# Patient Record
Sex: Female | Born: 1985 | Race: Black or African American | Hispanic: No | Marital: Single | Smoking: Never smoker
Health system: Southern US, Community
[De-identification: ages and names within clinical notes are randomized; demographics above are authoritative.]

## PROBLEM LIST (undated history)

## (undated) ENCOUNTER — Inpatient Hospital Stay (HOSPITAL_COMMUNITY): Payer: Self-pay

## (undated) DIAGNOSIS — E069 Thyroiditis, unspecified: Secondary | ICD-10-CM

## (undated) DIAGNOSIS — K219 Gastro-esophageal reflux disease without esophagitis: Secondary | ICD-10-CM

## (undated) DIAGNOSIS — F419 Anxiety disorder, unspecified: Secondary | ICD-10-CM

## (undated) DIAGNOSIS — I10 Essential (primary) hypertension: Secondary | ICD-10-CM

## (undated) DIAGNOSIS — D573 Sickle-cell trait: Secondary | ICD-10-CM

## (undated) DIAGNOSIS — D219 Benign neoplasm of connective and other soft tissue, unspecified: Secondary | ICD-10-CM

## (undated) DIAGNOSIS — D649 Anemia, unspecified: Secondary | ICD-10-CM

## (undated) HISTORY — DX: Essential (primary) hypertension: I10

## (undated) HISTORY — PX: WISDOM TOOTH EXTRACTION: SHX21

## (undated) HISTORY — DX: Anxiety disorder, unspecified: F41.9

---

## 2011-05-29 DIAGNOSIS — E010 Iodine-deficiency related diffuse (endemic) goiter: Secondary | ICD-10-CM | POA: Insufficient documentation

## 2014-01-14 ENCOUNTER — Emergency Department (HOSPITAL_COMMUNITY)
Admission: EM | Admit: 2014-01-14 | Discharge: 2014-01-14 | Disposition: A | Discharge status: Home / Self Care | Payer: 59 | Source: Home / Self Care

## 2014-01-14 DIAGNOSIS — R0789 Other chest pain: Secondary | ICD-10-CM

## 2014-01-14 DIAGNOSIS — R030 Elevated blood-pressure reading, without diagnosis of hypertension: Secondary | ICD-10-CM

## 2014-01-14 DIAGNOSIS — R071 Chest pain on breathing: Secondary | ICD-10-CM

## 2014-01-14 NOTE — ED Provider Notes (Signed)
CSN: [id number]     Arrival date & time 01/14/14  0945 History   First MD Initiated Contact with Patient 01/14/14 0955     No chief complaint on file.  (Consider location/radiation/quality/duration/timing/severity/associated sxs/prior Treatment) HPI Comments: 28 year old female presents to the urgent care for pain to the left lower anterolateral ribs. She was seen a week ago at [facility] for a lymph node evaluation and her blood pressure was 135/90. Subsequent blood pressures earlier this week was 146 over?, 130/70. Today 140/103. 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 She states she is under a lot of stress. This morning she noticed pain in the left anterolateral ribs at work. She was just coming on. It is described as a burning sensation. The burning comes and goes. It is of elicited with twisting the torso to the right. He may last up to 2 minutes at a time. No heaviness, tightness, pressure or fullness. Denies shortness of breath or GI symptoms. There is no retrosternal, precordial or anterior upper chest discomfort.   No past medical history on file. No past surgical history on file. No family history on file. History  Substance Use Topics  . Smoking status: Not on file  . Smokeless tobacco: Not on file  . Alcohol Use: Not on file   OB History   No data available     Review of Systems  Constitutional: Negative for fever, activity change and appetite change.  HENT: Negative.   Respiratory: Negative for cough, choking, chest tightness, shortness of breath, wheezing and stridor.   Cardiovascular: Negative for palpitations and leg swelling.  Gastrointestinal: Negative.   Genitourinary: Negative.   Musculoskeletal: Negative for arthralgias, back pain, gait problem, joint swelling, neck pain and neck stiffness.  Neurological: Negative.   Psychiatric/Behavioral: Negative.     Allergies  Review of patient's allergies  indicates not on file.  Home Medications   Prior to Admission medications   Not on File   BP 140/103  Pulse 103  Temp(Src) 97.5 F (36.4 C) (Oral)  Resp 16  SpO2 99% Physical Exam  Nursing note and vitals reviewed. Constitutional: She is oriented to person, place, and time. She appears well-developed and well-nourished. No distress.  Eyes: Conjunctivae and EOM are normal. Pupils are equal, round, and reactive to light.  Neck: Normal range of motion. Neck supple.  Cardiovascular: Normal rate, regular rhythm, normal heart sounds and intact distal pulses.   No murmur heard. Pulmonary/Chest: Effort normal. No respiratory distress. She has no wheezes. She has no rales. She exhibits no tenderness.  Abdominal: Soft. She exhibits no distension and no mass. There is no tenderness. There is no rebound and no guarding.  Musculoskeletal: She exhibits no edema and no tenderness.  Lymphadenopathy:    She has no cervical adenopathy.  Neurological: She is alert and oriented to person, place, and time. No cranial nerve deficit. She exhibits normal muscle tone. Coordination normal.  Skin: Skin is warm and dry. No rash noted. No erythema.  Psychiatric: She has a normal mood and affect. Her behavior is normal.    ED Course  Procedures (including critical care time) Labs Review Labs Reviewed - No data to display  Imaging Review No results found.  EKG: NSR, no ectopy. No ischemic changes or S-T abnormalities. MDM   1. Acute chest wall pain    Apply ice. NSAIDS prn F/U with a PCP for management of elevated BP Return for worsening or  new sx's or go to the ED as needed . Reassurance.    Janne Napoleon, NP 01/14/14 1051

## 2014-01-14 NOTE — ED Provider Notes (Signed)
Medical screening examination/treatment/procedure(s) were performed by resident physician or non-physician practitioner and as supervising physician I was immediately available for consultation/collaboration.   Pauline Good MD.   Billy Fischer, MD 01/14/14 [phone]

## 2014-01-14 NOTE — Discharge Instructions (Signed)
Chest Wall Pain Chest wall pain is pain felt in or around the chest bones and muscles. It may take up to 6 weeks to get better. It may take longer if you are active. Chest wall pain can happen on its own. Other times, things like germs, injury, coughing, or exercise can cause the pain. HOME CARE   Avoid activities that make you tired or cause pain. Try not to use your chest, belly (abdominal), or side muscles. Do not use heavy weights.  Put ice on the sore area.  Put ice in a plastic bag.  Place a towel between your skin and the bag.  Leave the ice on for 15-20 minutes for the first 2 days.  Only take medicine as told by your doctor. GET HELP RIGHT AWAY IF:   You have more pain or are very uncomfortable.  You have a fever.  Your chest pain gets worse.  You have new problems.  You feel sick to your stomach (nauseous) or throw up (vomit).  You start to sweat or feel lightheaded.  You have a cough with mucus (phlegm).  You cough up blood. MAKE SURE YOU:   Understand these instructions.  Will watch your condition.  Will get help right away if you are not doing well or get worse. Document Released: 10/09/2007 Document Revised: 07/15/2011 Document Reviewed: 12/17/2010 [facility] Patient Information 2015 [place], [place]. This information is not intended to replace advice given to you by your health care provider. Make sure you discuss any questions you have with your health care provider.  Chest Pain (Nonspecific) It is often hard to give a diagnosis for the cause of chest pain. There is always a chance that your pain could be related to something serious, such as a heart attack or a blood clot in the lungs. You need to follow up with your doctor. HOME CARE  If antibiotic medicine was given, take it as directed by your doctor. Finish the medicine even if you start to feel better.  For the next few days, avoid activities that bring on chest pain. Continue physical activities as  told by your doctor.  Do not use any tobacco products. This includes cigarettes, chewing tobacco, and e-cigarettes.  Avoid drinking alcohol.  Only take medicine as told by your doctor.  Follow your doctor's suggestions for more testing if your chest pain does not go away.  Keep all doctor visits you made. GET HELP IF:  Your chest pain does not go away, even after treatment.  You have a rash with blisters on your chest.  You have a fever. GET HELP RIGHT AWAY IF:   You have more pain or pain that spreads to your arm, neck, jaw, back, or belly (abdomen).  You have shortness of breath.  You cough more than usual or cough up blood.  You have very bad back or belly pain.  You feel sick to your stomach (nauseous) or throw up (vomit).  You have very bad weakness.  You pass out (faint).  You have chills. This is an emergency. Do not wait to see if the problems will go away. Call your local emergency services (911 in [place] Do not drive yourself to the hospital. MAKE SURE YOU:   Understand these instructions.  Will watch your condition.  Will get help right away if you are not doing well or get worse. Document Released: 10/09/2007 Document Revised: 04/27/2013 Document Reviewed: 10/09/2007 [facility] Patient Information 2015 [place], [place]. This information is not intended to  replace advice given to you by your health care provider. Make sure you discuss any questions you have with your health care provider.  Chest Pain Observation It is often hard to give a specific diagnosis for the cause of chest pain. Among other possibilities your symptoms might be caused by inadequate oxygen delivery to your heart (angina). Angina that is not treated or evaluated can lead to a heart attack (myocardial infarction) or death. Blood tests, electrocardiograms, and X-rays may have been done to help determine a possible cause of your chest pain. After evaluation and observation, your health care  provider has determined that it is unlikely your pain was caused by an unstable condition that requires hospitalization. However, a full evaluation of your pain may need to be completed, with additional diagnostic testing as directed. It is very important to keep your follow-up appointments. Not keeping your follow-up appointments could result in permanent heart damage, disability, or death. If there is any problem keeping your follow-up appointments, you must call your health care provider. HOME CARE INSTRUCTIONS  Due to the slight chance that your pain could be angina, it is important to follow your health care provider's treatment plan and also maintain a healthy lifestyle:  Maintain or work toward achieving a healthy weight.  Stay physically active and exercise regularly.  Decrease your salt intake.  Eat a balanced, healthy diet. Talk to a dietitian to learn about heart-healthy foods.  Increase your fiber intake by including whole grains, vegetables, fruits, and nuts in your diet.  Avoid situations that cause stress, anger, or depression.  Take medicines as advised by your health care provider. Report any side effects to your health care provider. Do not stop medicines or adjust the dosages on your own.  Quit smoking. Do not use nicotine patches or gum until you check with your health care provider.  Keep your blood pressure, blood sugar, and cholesterol levels within normal limits.  Limit alcohol intake to no more than 1 drink per day for women who are not pregnant and 2 drinks per day for men.  Do not abuse drugs. SEEK IMMEDIATE MEDICAL CARE IF: You have severe chest pain or pressure which may include symptoms such as:  You feel pain or pressure in your arms, neck, jaw, or back.  You have severe back or abdominal pain, feel sick to your stomach (nauseous), or throw up (vomit).  You are sweating profusely.  You are having a fast or irregular heartbeat.  You feel short of  breath while at rest.  You notice increasing shortness of breath during rest, sleep, or with activity.  You have chest pain that does not get better after rest or after taking your usual medicine.  You wake from sleep with chest pain.  You are unable to sleep because you cannot breathe.  You develop a frequent cough or you are coughing up blood.  You feel dizzy, faint, or experience extreme fatigue.  You develop severe weakness, dizziness, fainting, or chills. Any of these symptoms may represent a serious problem that is an emergency. Do not wait to see if the symptoms will go away. Call your local emergency services (911 in the [place] Do not drive yourself to the hospital. MAKE SURE YOU:  Understand these instructions.  Will watch your condition.  Will get help right away if you are not doing well or get worse. Document Released: 05/25/2010 Document Revised: 04/27/2013 Document Reviewed: 10/22/2012 [facility] Patient Information 2015 [place], [place]. This information is not intended  to replace advice given to you by your health care provider. Make sure you discuss any questions you have with your health care provider. ° °

## 2014-03-18 ENCOUNTER — Other Ambulatory Visit (HOSPITAL_COMMUNITY)
Admission: RE | Admit: 2014-03-18 | Discharge: 2014-03-18 | Disposition: A | Discharge status: Home / Self Care | Payer: 59 | Source: Ambulatory Visit | Attending: Obstetrics & Gynecology | Admitting: Obstetrics & Gynecology

## 2014-03-18 ENCOUNTER — Other Ambulatory Visit: Payer: Self-pay | Admitting: Obstetrics & Gynecology

## 2014-03-18 DIAGNOSIS — Z1151 Encounter for screening for human papillomavirus (HPV): Secondary | ICD-10-CM | POA: Insufficient documentation

## 2014-03-18 DIAGNOSIS — Z01419 Encounter for gynecological examination (general) (routine) without abnormal findings: Secondary | ICD-10-CM | POA: Insufficient documentation

## 2014-03-22 LAB — CYTOLOGY - PAP

## 2014-05-26 ENCOUNTER — Ambulatory Visit (INDEPENDENT_AMBULATORY_CARE_PROVIDER_SITE_OTHER): Payer: 59 | Admitting: Physician Assistant

## 2014-05-26 VITALS — BP 122/74 | HR 79 | Temp 98.0°F | Resp 17 | Ht 64.5 in | Wt 151.0 lb

## 2014-05-26 DIAGNOSIS — H9201 Otalgia, right ear: Secondary | ICD-10-CM

## 2014-05-26 DIAGNOSIS — R0981 Nasal congestion: Secondary | ICD-10-CM

## 2014-05-26 DIAGNOSIS — Z9109 Other allergy status, other than to drugs and biological substances: Secondary | ICD-10-CM

## 2014-05-26 DIAGNOSIS — Z889 Allergy status to unspecified drugs, medicaments and biological substances status: Secondary | ICD-10-CM

## 2014-05-26 MED ORDER — IPRATROPIUM BROMIDE 0.03 % NA SOLN: 2.0000 | Freq: Two times a day (BID) | NASAL | Status: DC

## 2014-05-26 MED ORDER — OFLOXACIN 0.3 % OT SOLN: 10.0000 [drp] | Freq: Every day | OTIC | Status: AC

## 2014-05-26 MED ORDER — CETIRIZINE HCL 10 MG PO TABS: 10.0000 mg | ORAL_TABLET | Freq: Every day | ORAL | Status: DC

## 2014-05-26 MED ORDER — OFLOXACIN 0.3 % OT SOLN: 10.0000 [drp] | Freq: Every day | OTIC | Status: DC

## 2014-05-26 MED ORDER — GUAIFENESIN ER 1200 MG PO TB12: 1.0000 | ORAL_TABLET | Freq: Two times a day (BID) | ORAL | Status: DC | PRN

## 2014-05-26 NOTE — Patient Instructions (Signed)
Please use the zyrtec, mucinex, and atrovent at this time.  If you have ear symptoms that continue in the next 48 hours, please fill out the prescription of antibiotic.  At this time, I am treating you for an upper respiratory infection.  Drink plenty of water (64 oz. Per day).  Upper Respiratory Infection, Adult An upper respiratory infection (URI) is also sometimes known as the common cold. The upper respiratory tract includes the nose, sinuses, throat, trachea, and bronchi. Bronchi are the airways leading to the lungs. Most people improve within 1 week, but symptoms can last up to 2 weeks. A residual cough may last even longer.  CAUSES Many different viruses can infect the tissues lining the upper respiratory tract. The tissues become irritated and inflamed and often become very moist. Mucus production is also common. A cold is contagious. You can easily spread the virus to others by oral contact. This includes kissing, sharing a glass, coughing, or sneezing. Touching your mouth or nose and then touching a surface, which is then touched by another person, can also spread the virus. SYMPTOMS  Symptoms typically develop 1 to 3 days after you come in contact with a cold virus. Symptoms vary from person to person. They may include:  Runny nose.  Sneezing.  Nasal congestion.  Sinus irritation.  Sore throat.  Loss of voice (laryngitis).  Cough.  Fatigue.  Muscle aches.  Loss of appetite.  Headache.  Low-grade fever. DIAGNOSIS  You might diagnose your own cold based on familiar symptoms, since most people get a cold 2 to 3 times a year. Your caregiver can confirm this based on your exam. Most importantly, your caregiver can check that your symptoms are not due to another disease such as strep throat, sinusitis, pneumonia, asthma, or epiglottitis. Blood tests, throat tests, and X-rays are not necessary to diagnose a common cold, but they may sometimes be helpful in excluding other more  serious diseases. Your caregiver will decide if any further tests are required. RISKS AND COMPLICATIONS  You may be at risk for a more severe case of the common cold if you smoke cigarettes, have chronic heart disease (such as heart failure) or lung disease (such as asthma), or if you have a weakened immune system. The very young and very old are also at risk for more serious infections. Bacterial sinusitis, middle ear infections, and bacterial pneumonia can complicate the common cold. The common cold can worsen asthma and chronic obstructive pulmonary disease (COPD). Sometimes, these complications can require emergency medical care and may be life-threatening. PREVENTION  The best way to protect against getting a cold is to practice good hygiene. Avoid oral or hand contact with people with cold symptoms. Wash your hands often if contact occurs. There is no clear evidence that vitamin C, vitamin E, echinacea, or exercise reduces the chance of developing a cold. However, it is always recommended to get plenty of rest and practice good nutrition. TREATMENT  Treatment is directed at relieving symptoms. There is no cure. Antibiotics are not effective, because the infection is caused by a virus, not by bacteria. Treatment may include:  Increased fluid intake. Sports drinks offer valuable electrolytes, sugars, and fluids.  Breathing heated mist or steam (vaporizer or shower).  Eating chicken soup or other clear broths, and maintaining good nutrition.  Getting plenty of rest.  Using gargles or lozenges for comfort.  Controlling fevers with ibuprofen or acetaminophen as directed by your caregiver.  Increasing usage of your inhaler if you  have asthma. Zinc gel and zinc lozenges, taken in the first 24 hours of the common cold, can shorten the duration and lessen the severity of symptoms. Pain medicines may help with fever, muscle aches, and throat pain. A variety of non-prescription medicines are  available to treat congestion and runny nose. Your caregiver can make recommendations and may suggest nasal or lung inhalers for other symptoms.  HOME CARE INSTRUCTIONS   Only take over-the-counter or prescription medicines for pain, discomfort, or fever as directed by your caregiver.  Use a warm mist humidifier or inhale steam from a shower to increase air moisture. This may keep secretions moist and make it easier to breathe.  Drink enough water and fluids to keep your urine clear or pale yellow.  Rest as needed.  Return to work when your temperature has returned to normal or as your caregiver advises. You may need to stay home longer to avoid infecting others. You can also use a face mask and careful hand washing to prevent spread of the virus. SEEK MEDICAL CARE IF:   After the first few days, you feel you are getting worse rather than better.  You need your caregiver's advice about medicines to control symptoms.  You develop chills, worsening shortness of breath, or brown or red sputum. These may be signs of pneumonia.  You develop yellow or brown nasal discharge or pain in the face, especially when you bend forward. These may be signs of sinusitis.  You develop a fever, swollen neck glands, pain with swallowing, or white areas in the back of your throat. These may be signs of strep throat. SEEK IMMEDIATE MEDICAL CARE IF:   You have a fever.  You develop severe or persistent headache, ear pain, sinus pain, or chest pain.  You develop wheezing, a prolonged cough, cough up blood, or have a change in your usual mucus (if you have chronic lung disease).  You develop sore muscles or a stiff neck. Document Released: 10/16/2000 Document Revised: 07/15/2011 Document Reviewed: 07/28/2013 [facility] Patient Information 2015 [place], [place]. This information is not intended to replace advice given to you by your health care provider. Make sure you discuss any questions you have with your  health care provider.

## 2014-05-27 ENCOUNTER — Encounter: Payer: Self-pay | Admitting: Physician Assistant

## 2014-05-27 NOTE — Progress Notes (Signed)
    MRN: [id number] DOB: June 05, 1985  Subjective:   Chief Complaint  Patient presents with  . Ear Pain    Nicole Mccall is a 29 y.o. female presenting for 3 days of of intermittent throbbing ear pain.  It is at her right ear only.  She had tried hydrogen peroxide at this ear, which helped, but then the pain returned later.  She does not have any drainage.  She denies hearing loss, fever, pain with facial movement, numbness or tingling.  She notes that she also has associated nasal congestion.  There are no sorethroat, difficulty with mastication, productive cough, or dyspnea.  Patient states that she has nasal congestion and runny nose every month around the same time since November.  She states that she may have multiple allergies, but she does not know any specific of what she allergic to.    Nicole Mccall has a current medication list which includes the following prescription(s): benazepril-hydrochlorthiazide, norethindrone-ethinyl estradiol, cetirizine, guaifenesin, ipratropium, and ofloxacin.  She has No Known Allergies.  Nicole Mccall  has a past medical history of Anxiety and Hypertension. Also  has no past surgical history on file.  ROS As in subjective.  Objective:   Vitals: BP 122/74 mmHg  Pulse 79  Temp(Src) 98 F (36.7 C) (Oral)  Resp 17  Ht 5' 4.5" (1.638 m)  Wt 151 lb (68.493 kg)  BMI 25.53 kg/m2  SpO2 99%  LMP 05/03/2014  Physical Exam  Constitutional: She is well-developed, well-nourished, and in no distress. No distress.  HENT:  Head: Normocephalic and atraumatic.  Nose: Mucosal edema (Mucosa slighly cyanotic.) and rhinorrhea present. Right sinus exhibits no maxillary sinus tenderness and no frontal sinus tenderness. Left sinus exhibits no maxillary sinus tenderness and no frontal sinus tenderness.  Mouth/Throat: No uvula swelling. No oropharyngeal exudate, posterior oropharyngeal edema or posterior oropharyngeal erythema.  Right ear: Normal external ear.  Mild  tenderness with pulling the ear back for viewing.  Faint red streak within the ear canal, however non-edematous.  TM is normal.  No perforation, injection, or erythema.  No fluid detected behind the TM.  Mastoid is normal without erythema or swelling. Left ear: Normal external ear.  Normal canal.  Normal TM.  Mastoid normal.   Eyes: Conjunctivae and EOM are normal. Pupils are equal, round, and reactive to light.  Neurological: She displays facial symmetry.  Skin: Skin is warm, dry and intact.  Psychiatric: Affect normal.    Assessment and Plan :  29 year old female is here at [facility] for ear pain.  Diff dx: Otitis externa, otitis media, upper respiratory infection with some eustacian tube dysfunction, allergies with sinus inflammaton.  Treating patient for upper respiratory infection and allergies.  Advised to use the mucinex, atrovent, and zyrtec.  In 48 hrs, fill the floxin.  RTC in 7 if symptoms do not resolve.  Nasal congestion - Plan: Guaifenesin (MUCINEX MAXIMUM STRENGTH) 1200 MG TB12, ipratropium (ATROVENT) 0.03 % nasal spray, ofloxacin (FLOXIN) 0.3 % otic solution, DISCONTINUED: ofloxacin (FLOXIN) 0.3 % otic solution  Ear pain, referred, right - Plan: ofloxacin (FLOXIN) 0.3 % otic solution, DISCONTINUED: ofloxacin (FLOXIN) 0.3 % otic solution  Multiple allergies - Plan: cetirizine (ZYRTEC) 10 MG tablet  Ivar Drape, PA-C [facility] 1/22/201611:14 AM

## 2015-05-08 MED FILL — HYDROCHLOROTHIAZIDE 12.5 MG: 12.5 | 90 days supply | Qty: 90 | Fill #3

## 2015-06-06 DIAGNOSIS — E041 Nontoxic single thyroid nodule: Secondary | ICD-10-CM | POA: Diagnosis not present

## 2015-06-08 ENCOUNTER — Other Ambulatory Visit: Payer: Self-pay | Admitting: Obstetrics & Gynecology

## 2015-06-08 ENCOUNTER — Other Ambulatory Visit (HOSPITAL_COMMUNITY)
Admission: RE | Admit: 2015-06-08 | Discharge: 2015-06-08 | Disposition: A | Discharge status: Home / Self Care | Payer: 59 | Source: Ambulatory Visit | Attending: Obstetrics & Gynecology | Admitting: Obstetrics & Gynecology

## 2015-06-08 DIAGNOSIS — Z01419 Encounter for gynecological examination (general) (routine) without abnormal findings: Secondary | ICD-10-CM | POA: Diagnosis not present

## 2015-06-08 DIAGNOSIS — Z113 Encounter for screening for infections with a predominantly sexual mode of transmission: Secondary | ICD-10-CM | POA: Insufficient documentation

## 2015-06-08 DIAGNOSIS — N930 Postcoital and contact bleeding: Secondary | ICD-10-CM | POA: Diagnosis not present

## 2015-06-12 LAB — CYTOLOGY - PAP

## 2015-06-19 MED FILL — LARIN FE 1.5-30 TABLET: 1.5-30 | 84 days supply | Qty: 84 | Fill #0

## 2015-06-22 DIAGNOSIS — I10 Essential (primary) hypertension: Secondary | ICD-10-CM | POA: Diagnosis not present

## 2015-09-19 MED FILL — HYDROCHLOROTHIAZIDE 12.5 MG: 12.5 | 90 days supply | Qty: 90 | Fill #0

## 2015-09-25 DIAGNOSIS — N760 Acute vaginitis: Secondary | ICD-10-CM | POA: Diagnosis not present

## 2015-09-25 DIAGNOSIS — B373 Candidiasis of vulva and vagina: Secondary | ICD-10-CM | POA: Diagnosis not present

## 2015-09-26 MED FILL — metroNIDAZOLE 0.75 % GEL: 0.75 | 5 days supply | Qty: 70 | Fill #0

## 2015-10-26 DIAGNOSIS — Z8742 Personal history of other diseases of the female genital tract: Secondary | ICD-10-CM | POA: Diagnosis not present

## 2015-10-26 DIAGNOSIS — N930 Postcoital and contact bleeding: Secondary | ICD-10-CM | POA: Diagnosis not present

## 2015-10-26 DIAGNOSIS — Z309 Encounter for contraceptive management, unspecified: Secondary | ICD-10-CM | POA: Diagnosis not present

## 2015-11-27 MED FILL — LARIN FE 1.5-30 TABLET: 1.5-30 | 84 days supply | Qty: 84 | Fill #1

## 2015-11-29 DIAGNOSIS — N930 Postcoital and contact bleeding: Secondary | ICD-10-CM | POA: Diagnosis not present

## 2015-11-29 DIAGNOSIS — N859 Noninflammatory disorder of uterus, unspecified: Secondary | ICD-10-CM | POA: Diagnosis not present

## 2016-02-28 ENCOUNTER — Ambulatory Visit (INDEPENDENT_AMBULATORY_CARE_PROVIDER_SITE_OTHER): Payer: 59 | Admitting: Family Medicine

## 2016-02-28 VITALS — BP 122/72 | HR 78 | Temp 98.3°F | Resp 17 | Ht 65.0 in | Wt 153.0 lb

## 2016-02-28 DIAGNOSIS — R3129 Other microscopic hematuria: Secondary | ICD-10-CM

## 2016-02-28 DIAGNOSIS — N760 Acute vaginitis: Secondary | ICD-10-CM | POA: Diagnosis not present

## 2016-02-28 DIAGNOSIS — B9689 Other specified bacterial agents as the cause of diseases classified elsewhere: Secondary | ICD-10-CM | POA: Diagnosis not present

## 2016-02-28 LAB — POCT WET + KOH PREP
Trich by wet prep: ABSENT
Yeast by KOH: ABSENT
Yeast by wet prep: ABSENT

## 2016-02-28 LAB — POCT URINALYSIS DIP (MANUAL ENTRY)
BILIRUBIN UA: NEGATIVE
GLUCOSE UA: NEGATIVE
Leukocytes, UA: NEGATIVE
Nitrite, UA: NEGATIVE
Protein Ur, POC: NEGATIVE
Spec Grav, UA: >=1.03
Urobilinogen, UA: 0.2
pH, UA: 5

## 2016-02-28 LAB — POC MICROSCOPIC URINALYSIS (UMFC): Mucus: ABSENT

## 2016-02-28 MED ORDER — METRONIDAZOLE 500 MG PO TABS: 500.0000 mg | ORAL_TABLET | Freq: Two times a day (BID) | ORAL | 0 refills | Status: DC

## 2016-02-28 MED FILL — metroNIDAZOLE 500 MG TABS: 500 | 7 days supply | Qty: 14 | Fill #0

## 2016-02-28 MED FILL — LARIN FE 1.5-30 TABLET: 1.5-30 | 84 days supply | Qty: 84 | Fill #2

## 2016-02-28 NOTE — Progress Notes (Signed)
By signing my name below, I, Mesha Guinyard, attest that this documentation has been prepared under the direction and in the presence of Delman Cheadle, MD.  Electronically Signed: Verlee Monte, Medical Scribe. 02/28/16. 8:29 AM.  Subjective:    Patient ID: Nicole Mccall, female    DOB: 09-11-1985, 30 y.o.   MRN: [id number]  HPI Chief Complaint  Patient presents with  . Vaginitis    HPI Comments: Nicole Mccall is a 30 y.o. female who presents to the [facility] complaining of malodorus pale yellow vaginal discharge onset 2 weeks. Pt reports associated symptoms of vaginal itchiness, and occasional dysuria. Pt has tried AZO yeast without relief to her symptoms. 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 Pt is compliant with her birthcontrol pills and no possibility of pregnancy. Pt denies vaginal pain, urinary urgency, and urinary frequency.  Is due to start menses in the next sev d.  There are no active problems to display for this patient.  Past Medical History:  Diagnosis Date  . Anxiety   . Hypertension    No past surgical history on file. No Known Allergies Prior to Admission medications   Medication Sig Start Date End Date Taking? Authorizing Provider  cetirizine (ZYRTEC) 10 MG tablet Take 1 tablet (10 mg total) by mouth daily. 05/26/14  Yes Stephanie D English, PA  hydrochlorothiazide (MICROZIDE) 12.5 MG capsule Take 12.5 mg by mouth daily.   Yes Historical Provider, MD  norethindrone-ethinyl estradiol (JUNEL FE,GILDESS FE,LOESTRIN FE) 1-20 MG-MCG tablet Take 1 tablet by mouth daily.   Yes Historical Provider, MD   Social History   Social History  . Marital status: Single    Spouse name: N/A  . Number of children: N/A  . Years of education: N/A   Occupational History  . Not on file.   Social History Main Topics  . Smoking status: Never Smoker  .  Smokeless tobacco: Never Used  . Alcohol use No  . Drug use: No  . Sexual activity: No   Other Topics Concern  . Not on file   Social History Narrative  . No narrative on file   Depression screen [facility] 2/9 02/28/2016  Decreased Interest 0  Down, Depressed, Hopeless 0  PHQ - 2 Score 0    Review of Systems  Constitutional: Negative for activity change, appetite change, chills and fever.  Cardiovascular: Negative for leg swelling.  Gastrointestinal: Negative for abdominal pain, anal bleeding, blood in stool, constipation, diarrhea and rectal pain.  Genitourinary: Positive for dysuria and vaginal discharge. Negative for difficulty urinating, frequency, genital sores, menstrual problem, pelvic pain, urgency, vaginal bleeding and vaginal pain.  Allergic/Immunologic: Negative for immunocompromised state.  Hematological: Negative for adenopathy.     Objective:  Physical Exam  Constitutional: She appears well-developed and well-nourished. No distress.  HENT:  Head: Normocephalic and atraumatic.  Eyes: Conjunctivae are normal.  Neck: Neck supple.  Cardiovascular: Normal rate.   Pulmonary/Chest: Effort normal.  Genitourinary: Vagina normal.  Genitourinary Comments: Nl external, vagina, cervix Moderate amount of thick white discharge No cervical motion tenderness  Neurological: She is alert.  Skin: Skin is warm and dry.  Psychiatric: She has a normal mood and affect. Her behavior is normal.  Nursing note and vitals reviewed.  BP 122/72 (BP Location: Right Arm, Patient Position: Sitting, Cuff Size: Normal)   Pulse  78   Temp 98.3 F (36.8 C) (Oral)   Resp 17   Ht 5\' 5"  (1.651 m)   Wt 153 lb (69.4 kg)   LMP 02/07/2016 (Approximate)   SpO2 98%   BMI 25.46 kg/m    Results for orders placed or performed in visit on 02/28/16  POCT Wet + KOH Prep  Result Value Ref Range   Yeast by KOH Absent Present, Absent   Yeast by wet prep Absent Present, Absent   WBC by wet prep None None,  Few, Too numerous to count   Clue Cells Wet Prep HPF POC Too numerous to count  None, Too numerous to count   Trich by wet prep Absent Present, Absent   Bacteria Wet Prep HPF POC Few None, Few, Too numerous to count   Epithelial Cells By [employer] ([facility]) Many (A) None, Few, Too numerous to count   RBC,UR,HPF,POC None None RBC/hpf  POCT urinalysis dipstick  Result Value Ref Range   Color, UA yellow yellow   Clarity, UA clear clear   Glucose, UA negative negative   Bilirubin, UA negative negative   Ketones, POC UA trace (5) (A) negative   Spec Grav, UA >=1.030    Blood, UA moderate (A) negative   pH, UA 5.0    Protein Ur, POC negative negative   Urobilinogen, UA 0.2    Nitrite, UA Negative Negative   Leukocytes, UA Negative Negative  POCT Microscopic Urinalysis ([facility])  Result Value Ref Range   WBC,UR,HPF,POC None None WBC/hpf   RBC,UR,HPF,POC Few (A) None RBC/hpf   Bacteria Few (A) None, Too numerous to count   Mucus Absent Absent   Epithelial Cells, UR Per Microscopy None None, Too numerous to count cells/hpf   Assessment & Plan:   1. Vaginitis and vulvovaginitis   2. Bacterial vaginosis - ok to call in diflucan if sxs don't complete resolve after flagyl course as did clinically look like yeast infxn.   Microscopic hematuria likely due to dehydration and may start menses in sev d.  Likely insig but rec recheck at next routine f/u OV to ensure resolved.  Orders Placed This Encounter  Procedures  . POCT Wet + KOH Prep  . POCT urinalysis dipstick  . POCT Microscopic Urinalysis ([facility])    Meds ordered this encounter  Medications  . hydrochlorothiazide (MICROZIDE) 12.5 MG capsule    Sig: Take 12.5 mg by mouth daily.  . metroNIDAZOLE (FLAGYL) 500 MG tablet    Sig: Take 1 tablet (500 mg total) by mouth 2 (two) times daily.    Dispense:  14 tablet    Refill:  0    I personally performed the services described in this documentation, which was scribed in my presence. The  recorded information has been reviewed and considered, and addended by me as needed.   Delman Cheadle, M.D.  [facility] [address] [place], [place] [address] [phone] phone [phone] fax  02/28/16 9:55 AM

## 2016-02-28 NOTE — Patient Instructions (Addendum)
IF you received an x-ray today, you will receive an invoice from [facility]. Please contact [facility] at [phone] with questions or concerns regarding your invoice.   IF you received labwork today, you will receive an invoice from [employer]. Please contact [facility] at [phone] with questions or concerns regarding your invoice.   Our billing staff will not be able to assist you with questions regarding bills from these companies.  You will be contacted with the lab results as soon as they are available. The fastest way to get your results is to activate your My Chart account. Instructions are located on the last page of this paperwork. If you have not heard from [place] regarding the results in 2 weeks, please contact this office.     Bacterial Vaginosis Bacterial vaginosis is a vaginal infection that occurs when the normal balance of bacteria in the vagina is disrupted. It results from an overgrowth of certain bacteria. This is the most common vaginal infection in women of childbearing age. Treatment is important to prevent complications, especially in pregnant women, as it can cause a premature delivery. CAUSES  Bacterial vaginosis is caused by an increase in harmful bacteria that are normally present in smaller amounts in the vagina. Several different kinds of bacteria can cause bacterial vaginosis. However, the reason that the condition develops is not fully understood. RISK FACTORS Certain activities or behaviors can put you at an increased risk of developing bacterial vaginosis, including:  Having a new sex partner or multiple sex partners.  Douching.  Using an intrauterine device (IUD) for contraception. Women do not get bacterial vaginosis from toilet seats, bedding, swimming pools, or contact with objects around them. SIGNS AND SYMPTOMS  Some women with bacterial vaginosis have no signs or symptoms. Common symptoms  include:  Grey vaginal discharge.  A fishlike odor with discharge, especially after sexual intercourse.  Itching or burning of the vagina and vulva.  Burning or pain with urination. DIAGNOSIS  Your health care provider will take a medical history and examine the vagina for signs of bacterial vaginosis. A sample of vaginal fluid may be taken. Your health care provider will look at this sample under a microscope to check for bacteria and abnormal cells. A vaginal pH test may also be done.  TREATMENT  Bacterial vaginosis may be treated with antibiotic medicines. These may be given in the form of a pill or a vaginal cream. A second round of antibiotics may be prescribed if the condition comes back after treatment. Because bacterial vaginosis increases your risk for sexually transmitted diseases, getting treated can help reduce your risk for chlamydia, gonorrhea, HIV, and herpes. HOME CARE INSTRUCTIONS   Only take over-the-counter or prescription medicines as directed by your health care provider.  If antibiotic medicine was prescribed, take it as directed. Make sure you finish it even if you start to feel better.  Tell all sexual partners that you have a vaginal infection. They should see their health care provider and be treated if they have problems, such as a mild rash or itching.  During treatment, it is important that you follow these instructions:  Avoid sexual activity or use condoms correctly.  Do not douche.  Avoid alcohol as directed by your health care provider.  Avoid breastfeeding as directed by your health care provider. SEEK MEDICAL CARE IF:   Your symptoms are not improving after 3 days of treatment.  You have increased discharge or pain.  You have  a fever. MAKE SURE YOU:   Understand these instructions.  Will watch your condition.  Will get help right away if you are not doing well or get worse. FOR MORE INFORMATION  Centers for Disease Control and  Prevention, Division of STD Prevention: [employer] American Sexual Health Association (ASHA): www.ashastd.org    This information is not intended to replace advice given to you by your health care provider. Make sure you discuss any questions you have with your health care provider.   Document Released: 04/22/2005 Document Revised: 05/13/2014 Document Reviewed: 12/02/2012 Elsevier Interactive Patient Education [employer].

## 2016-03-07 MED FILL — HYDROCHLOROTHIAZIDE 12.5 MG: 12.5 | 90 days supply | Qty: 90 | Fill #0

## 2016-04-05 DIAGNOSIS — Z3041 Encounter for surveillance of contraceptive pills: Secondary | ICD-10-CM | POA: Diagnosis not present

## 2016-04-05 DIAGNOSIS — N930 Postcoital and contact bleeding: Secondary | ICD-10-CM | POA: Diagnosis not present

## 2016-04-05 DIAGNOSIS — Z01411 Encounter for gynecological examination (general) (routine) with abnormal findings: Secondary | ICD-10-CM | POA: Diagnosis not present

## 2016-04-05 DIAGNOSIS — N898 Other specified noninflammatory disorders of vagina: Secondary | ICD-10-CM | POA: Diagnosis not present

## 2016-04-05 DIAGNOSIS — E049 Nontoxic goiter, unspecified: Secondary | ICD-10-CM | POA: Diagnosis not present

## 2016-04-13 ENCOUNTER — Ambulatory Visit: Payer: 59

## 2016-04-16 ENCOUNTER — Ambulatory Visit (INDEPENDENT_AMBULATORY_CARE_PROVIDER_SITE_OTHER): Payer: 59 | Admitting: Physician Assistant

## 2016-04-16 VITALS — BP 124/76 | HR 100 | Temp 98.4°F | Resp 16 | Ht 65.0 in | Wt 157.0 lb

## 2016-04-16 DIAGNOSIS — J029 Acute pharyngitis, unspecified: Secondary | ICD-10-CM | POA: Diagnosis not present

## 2016-04-16 DIAGNOSIS — B9789 Other viral agents as the cause of diseases classified elsewhere: Secondary | ICD-10-CM

## 2016-04-16 DIAGNOSIS — J069 Acute upper respiratory infection, unspecified: Secondary | ICD-10-CM | POA: Diagnosis not present

## 2016-04-16 DIAGNOSIS — I10 Essential (primary) hypertension: Secondary | ICD-10-CM | POA: Insufficient documentation

## 2016-04-16 LAB — POCT RAPID STREP A (OFFICE): RAPID STREP A SCREEN: NEGATIVE

## 2016-04-16 MED ORDER — HYDROCODONE-HOMATROPINE 5-1.5 MG/5ML PO SYRP: 5.0000 mL | ORAL_SOLUTION | Freq: Three times a day (TID) | ORAL | 0 refills | Status: DC | PRN

## 2016-04-16 MED ORDER — IBUPROFEN 800 MG PO TABS: 800.0000 mg | ORAL_TABLET | Freq: Three times a day (TID) | ORAL | 0 refills | Status: DC | PRN

## 2016-04-16 MED ORDER — IPRATROPIUM BROMIDE 0.06 % NA SOLN: 2.0000 | Freq: Three times a day (TID) | NASAL | 0 refills | Status: DC

## 2016-04-16 MED ORDER — FIRST-MOUTHWASH BLM MT SUSP: OROMUCOSAL | 0 refills | Status: DC

## 2016-04-16 MED FILL — MAGIC MW LID/MAAL/DP1:1:1: 2 | 3 days supply | Qty: 120 | Fill #0

## 2016-04-16 MED FILL — HYDROCODONE-HOMATROPINE SYR: 5-1.5 | 8 days supply | Qty: 120 | Fill #0

## 2016-04-16 MED FILL — IBUPROFEN 800 MG TABLET: 800 | 20 days supply | Qty: 60 | Fill #0

## 2016-04-16 MED FILL — IPRATROPIUM 0.06% SPRAY: 0.06 | 30 days supply | Qty: 15 | Fill #0

## 2016-04-16 NOTE — Progress Notes (Signed)
   Nicole Mccall  MRN: [id number] DOB: 1985/12/27  Subjective:  Pt presents to clinic with sore throat for the last 2 days that seems to be getting worse.  She has some nasal congestion but it seems to be going down her throat and not out her nose.  She has no cough.    Medications - motrin and chloroseptic lozenges and salt water gargles No exposures to strep known  Review of Systems  Constitutional: Negative for chills and fever.  HENT: Positive for postnasal drip and sore throat. Negative for congestion.   Respiratory: Negative for cough.   Gastrointestinal: Negative.     Patient Active Problem List   Diagnosis Date Noted  . Benign essential HTN 04/16/2016  . 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 hydrochlorothiazide (MICROZIDE) 12.5 MG capsule Take 12.5 mg by mouth daily.    . norethindrone-ethinyl estradiol (JUNEL FE,GILDESS FE,LOESTRIN FE) 1-20 MG-MCG tablet Take 1 tablet by mouth daily.     No current facility-administered medications on file prior to visit.     No Known Allergies  Pt patients past, family and social history were reviewed and updated.   Objective:  BP 124/76   Pulse 100   Temp 98.4 F (36.9 C) (Oral)   Resp 16   Ht 5\' 5"  (1.651 m)   Wt 157 lb (71.2 kg)   LMP 04/03/2016   SpO2 100%   BMI 26.13 kg/m   Physical Exam  Constitutional: She is oriented to person, place, and time and well-developed, well-nourished, and in no distress.  HENT:  Head: Normocephalic and atraumatic.  Right Ear: Hearing, tympanic membrane, external ear and ear canal normal.  Left Ear: Hearing, tympanic membrane, external ear and ear canal normal.  Nose: Mucosal edema: red and swollen.  Mouth/Throat: Uvula is midline and mucous membranes are normal. Posterior oropharyngeal edema and posterior oropharyngeal erythema present. No oropharyngeal exudate.  Eyes: Conjunctivae are normal.  Neck:  Normal range of motion.  Cardiovascular: Normal rate, regular rhythm and normal heart sounds.   No murmur heard. Pulmonary/Chest: Effort normal and breath sounds normal.  Neurological: She is alert and oriented to person, place, and time. Gait normal.  Skin: Skin is warm and dry.  Psychiatric: Mood, memory, affect and judgment normal.  Vitals reviewed.   Results for orders placed or performed in visit on 04/16/16  POCT rapid strep A  Result Value Ref Range   Rapid Strep A Screen Negative Negative    Assessment and Plan :  Sore throat - Plan: POCT rapid strep A, ibuprofen (ADVIL,MOTRIN) 800 MG tablet  Viral URI - Plan: ipratropium (ATROVENT) 0.06 % nasal spray, HYDROcodone-homatropine (HYCODAN) 5-1.5 MG/5ML syrup, DPH-Lido-AlHydr-MgHydr-Simeth (FIRST-MOUTHWASH BLM) SUSP   Symptomatic care d/w pt  Windell Hummingbird PA-C  [facility] 04/16/2016 9:15 AM

## 2016-04-16 NOTE — Patient Instructions (Signed)
     IF you received an x-ray today, you will receive an invoice from [facility]. Please contact [facility] at [phone] with questions or concerns regarding your invoice.   IF you received labwork today, you will receive an invoice from [facility]. Please contact [facility] at [phone] with questions or concerns regarding your invoice.   Our billing staff will not be able to assist you with questions regarding bills from these companies.  You will be contacted with the lab results as soon as they are available. The fastest way to get your results is to activate your My Chart account. Instructions are located on the last page of this paperwork. If you have not heard from us regarding the results in 2 weeks, please contact this office.      

## 2016-05-20 DIAGNOSIS — E049 Nontoxic goiter, unspecified: Secondary | ICD-10-CM | POA: Diagnosis not present

## 2016-05-20 DIAGNOSIS — I10 Essential (primary) hypertension: Secondary | ICD-10-CM | POA: Diagnosis not present

## 2016-05-20 DIAGNOSIS — N930 Postcoital and contact bleeding: Secondary | ICD-10-CM | POA: Diagnosis not present

## 2016-05-20 DIAGNOSIS — Z0000 Encounter for general adult medical examination without abnormal findings: Secondary | ICD-10-CM | POA: Diagnosis not present

## 2016-05-20 DIAGNOSIS — D649 Anemia, unspecified: Secondary | ICD-10-CM | POA: Diagnosis not present

## 2016-05-30 MED FILL — LARIN FE 1.5-30 TABLET: 1.5-30 | 84 days supply | Qty: 84 | Fill #0

## 2016-06-14 ENCOUNTER — Ambulatory Visit (INDEPENDENT_AMBULATORY_CARE_PROVIDER_SITE_OTHER): Payer: 59 | Admitting: Physician Assistant

## 2016-06-14 VITALS — BP 122/80 | HR 103 | Temp 97.9°F | Resp 18 | Ht 65.0 in | Wt 156.0 lb

## 2016-06-14 DIAGNOSIS — R8299 Other abnormal findings in urine: Secondary | ICD-10-CM | POA: Diagnosis not present

## 2016-06-14 DIAGNOSIS — R82998 Other abnormal findings in urine: Secondary | ICD-10-CM

## 2016-06-14 DIAGNOSIS — Z7251 High risk heterosexual behavior: Secondary | ICD-10-CM | POA: Diagnosis not present

## 2016-06-14 DIAGNOSIS — R300 Dysuria: Secondary | ICD-10-CM

## 2016-06-14 DIAGNOSIS — Z113 Encounter for screening for infections with a predominantly sexual mode of transmission: Secondary | ICD-10-CM | POA: Diagnosis not present

## 2016-06-14 LAB — POCT URINALYSIS DIP (MANUAL ENTRY)
BILIRUBIN UA: NEGATIVE
GLUCOSE UA: NEGATIVE
NITRITE UA: NEGATIVE
Protein Ur, POC: NEGATIVE
Spec Grav, UA: 1.015
Urobilinogen, UA: 0.2
pH, UA: 6.5

## 2016-06-14 LAB — POC MICROSCOPIC URINALYSIS (UMFC): Mucus: ABSENT

## 2016-06-14 MED ORDER — NITROFURANTOIN MONOHYD MACRO 100 MG PO CAPS: 100.0000 mg | ORAL_CAPSULE | Freq: Two times a day (BID) | ORAL | 0 refills | Status: AC

## 2016-06-14 MED FILL — NITROFURANTOIN MONO-MCR 100: 100 | 7 days supply | Qty: 14 | Fill #0

## 2016-06-14 NOTE — Patient Instructions (Addendum)
Remember to hydrate well. Continue to take Ibuprofen and/or Tylenol for fever/pain Urinary Tract Infection, Adult Introduction A urinary tract infection (UTI) is an infection of any part of the urinary tract. The urinary tract includes the:  Kidneys.  Ureters.  Bladder.  Urethra. These organs make, store, and get rid of pee (urine) in the body. Follow these instructions at home:  Take over-the-counter and prescription medicines only as told by your doctor.  If you were prescribed an antibiotic medicine, take it as told by your doctor. Do not stop taking the antibiotic even if you start to feel better.  Avoid the following drinks:  Alcohol.  Caffeine.  Tea.  Carbonated drinks.  Drink enough fluid to keep your pee clear or pale yellow.  Keep all follow-up visits as told by your doctor. This is important.  Make sure to:  Empty your bladder often and completely. Do not to hold pee for long periods of time.  Empty your bladder before and after sex.  Wipe from front to back after a bowel movement if you are female. Use each tissue one time when you wipe. Contact a doctor if:  You have back pain.  You have a fever.  You feel sick to your stomach (nauseous).  You throw up (vomit).  Your symptoms do not get better after 3 days.  Your symptoms go away and then come back. Get help right away if:  You have very bad back pain.  You have very bad lower belly (abdominal) pain.  You are throwing up and cannot keep down any medicines or water. This information is not intended to replace advice given to you by your health care provider. Make sure you discuss any questions you have with your health care provider. Document Released: 10/09/2007 Document Revised: 09/28/2015 Document Reviewed: 03/13/2015  2017 Elsevier     IF you received an x-ray today, you will receive an invoice from [facility]. Please contact [facility] at [phone] with  questions or concerns regarding your invoice.   IF you received labwork today, you will receive an invoice from [facility]. Please contact [facility] at [phone] with questions or concerns regarding your invoice.   Our billing staff will not be able to assist you with questions regarding bills from these companies.  You will be contacted with the lab results as soon as they are available. The fastest way to get your results is to activate your My Chart account. Instructions are located on the last page of this paperwork. If you have not heard from [place] regarding the results in 2 weeks, please contact this office.

## 2016-06-14 NOTE — Progress Notes (Signed)
[facility] [address] [place] [address] [phone]  Date:  06/14/2016   Name:  Nicole Mccall   DOB:  1985/11/13   MRN:  [id number]  PCP:  No PCP Per Patient    History of Present Illness:  Nicole Mccall is a 31 y.o. female patient who presents to [facility] for cc of dysuria.   6 days ago, dysuria.  She took ibuprofen and heavy hydration, and the symptoms improved.  She has frequent urination with little urine expelled.  She thought it resolve 4 days later, however last night burning sensation with urine and cloudy content returned.  No odor, yellowish gray.  No hematuria.   No nausea, fever, back pain. No vaginal discharge or pruritus.    Patient Active Problem List   Diagnosis Date Noted  . Benign essential HTN 04/16/2016  . 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 Anxiety   . Hypertension     History reviewed. No pertinent surgical history.  Social History  Substance Use Topics  . Smoking status: Never Smoker  . Smokeless tobacco: Never Used  . Alcohol use No    Family History  Problem Relation Age of Onset  . Cancer Mother   . Cancer Father   . Hyperlipidemia Father   . Diabetes Maternal Grandmother   . Stroke Maternal Grandmother   . Hypertension Maternal Grandfather   . Mental illness Maternal Grandfather     No Known Allergies  Medication list has been reviewed and updated.  Current Outpatient Prescriptions on File Prior to Visit  Medication Sig Dispense Refill  . hydrochlorothiazide (MICROZIDE) 12.5 MG capsule Take 12.5 mg by mouth daily.    Marland Kitchen ibuprofen (ADVIL,MOTRIN) 800 MG tablet Take 1 tablet (800 mg total) by mouth every 8 (eight) hours as needed. 60 tablet 0  . norethindrone-ethinyl estradiol (JUNEL FE,GILDESS FE,LOESTRIN FE) 1-20 MG-MCG tablet Take 1 tablet by mouth daily.    Marland Kitchen ipratropium (ATROVENT) 0.06 % nasal spray Place 2 sprays into the nose 3 (three) times daily. (Patient not  taking: Reported on 06/14/2016) 15 mL 0   No current facility-administered medications on file prior to visit.     ROS ROS otherwise unremarkable unless listed above.   Physical Examination: BP 122/80 (BP Location: Right Arm, Patient Position: Sitting, Cuff Size: Small)   Pulse (!) 103   Temp 97.9 F (36.6 C) (Oral)   Resp 18   Ht 5\' 5"  (1.651 m)   Wt 156 lb (70.8 kg)   SpO2 100%   BMI 25.96 kg/m  Ideal Body Weight: Weight in (lb) to have BMI = 25: 149.9  Physical Exam  Constitutional: She is oriented to person, place, and time. She appears well-developed and well-nourished. No distress.  HENT:  Head: Normocephalic and atraumatic.  Right Ear: External ear normal.  Left Ear: External ear normal.  Eyes: Conjunctivae and EOM are normal. Pupils are equal, round, and reactive to light.  Cardiovascular: Normal rate.   Pulmonary/Chest: Effort normal. No respiratory distress.  Abdominal: Soft. Normal appearance. There is tenderness in the suprapubic area. There is no CVA tenderness.  Neurological: She is alert and oriented to person, place, and time.  Skin: She is not diaphoretic.  Psychiatric: She has a normal mood and affect. Her behavior is normal.   Results for orders placed or performed in visit on 06/14/16  Urine culture  Result Value Ref Range   Urine Culture, Routine Preliminary  report (A)    Urine Culture result 1 Escherichia coli (A)   GC/Chlamydia Probe Amp  Result Value Ref Range   Chlamydia trachomatis, NAA Negative Negative   Neisseria gonorrhoeae by PCR Negative Negative  HIV antibody (with reflex)  Result Value Ref Range   HIV Screen 4th Generation wRfx Non Reactive Non Reactive  POCT urinalysis dipstick  Result Value Ref Range   Color, UA yellow yellow   Clarity, UA clear clear   Glucose, UA negative negative   Bilirubin, UA negative negative   Ketones, POC UA small (15) (A) negative   Spec Grav, UA 1.015    Blood, UA moderate (A) negative   pH, UA 6.5     Protein Ur, POC negative negative   Urobilinogen, UA 0.2    Nitrite, UA Negative Negative   Leukocytes, UA small (1+) (A) Negative  POCT Microscopic Urinalysis ([facility])  Result Value Ref Range   WBC,UR,HPF,POC Too numerous to count  (A) None WBC/hpf   RBC,UR,HPF,POC Moderate (A) None RBC/hpf   Bacteria Many (A) None, Too numerous to count   Mucus Absent Absent   Epithelial Cells, UR Per Microscopy Few (A) None, Too numerous to count cells/hpf      Assessment and Plan: Nicole Mccall is a 31 y.o. female who is here today for cc of dysuria. Will start [place].  Urine culture placed.  obtained HIV, gc./chl.  Follow up with results. Dysuria - Plan: POCT urinalysis dipstick, POCT Microscopic Urinalysis ([facility]), Urine culture, GC/Chlamydia Probe Amp  Screening for STD (sexually transmitted disease) - Plan: GC/Chlamydia Probe Amp, HIV antibody (with reflex)  Unprotected sex - Plan: GC/Chlamydia Probe Amp, HIV antibody (with reflex)  Ivar Drape, PA-C [facility] 2/11/20187:54 PM

## 2016-06-15 LAB — HIV ANTIBODY (ROUTINE TESTING W REFLEX): HIV SCREEN 4TH GENERATION: NONREACTIVE

## 2016-06-16 LAB — GC/CHLAMYDIA PROBE AMP
Chlamydia trachomatis, NAA: NEGATIVE
Neisseria gonorrhoeae by PCR: NEGATIVE

## 2016-06-17 LAB — URINE CULTURE

## 2016-07-04 MED FILL — FLUCONAZOLE 150 MG TABLET: 150 | 8 days supply | Qty: 2 | Fill #0

## 2016-08-15 MED FILL — HYDROCHLOROTHIAZIDE 12.5 MG: 12.5 | 90 days supply | Qty: 90 | Fill #0

## 2016-08-15 MED FILL — LARIN FE 1.5-30 TABLET: 1.5-30 | 84 days supply | Qty: 84 | Fill #1

## 2016-09-06 DIAGNOSIS — N859 Noninflammatory disorder of uterus, unspecified: Secondary | ICD-10-CM | POA: Diagnosis not present

## 2016-09-06 DIAGNOSIS — N760 Acute vaginitis: Secondary | ICD-10-CM | POA: Diagnosis not present

## 2016-09-06 DIAGNOSIS — N930 Postcoital and contact bleeding: Secondary | ICD-10-CM | POA: Diagnosis not present

## 2016-09-06 MED FILL — metroNIDAZOLE 500 MG TABS: 500 | 7 days supply | Qty: 14 | Fill #0

## 2016-10-01 MED FILL — FLUCONAZOLE 150 MG TABLET: 150 | 7 days supply | Qty: 2 | Fill #0

## 2016-11-07 MED FILL — LARIN FE 1.5-30 TABLET: 1.5-30 | 84 days supply | Qty: 84 | Fill #2

## 2016-11-07 MED FILL — metroNIDAZOLE 500 MG TABS: 500 | 7 days supply | Qty: 14 | Fill #1

## 2016-11-18 DIAGNOSIS — Z6826 Body mass index (BMI) 26.0-26.9, adult: Secondary | ICD-10-CM | POA: Diagnosis not present

## 2016-11-18 DIAGNOSIS — E611 Iron deficiency: Secondary | ICD-10-CM | POA: Diagnosis not present

## 2016-11-18 DIAGNOSIS — I10 Essential (primary) hypertension: Secondary | ICD-10-CM | POA: Diagnosis not present

## 2016-11-22 DIAGNOSIS — N930 Postcoital and contact bleeding: Secondary | ICD-10-CM | POA: Diagnosis not present

## 2016-11-22 DIAGNOSIS — Z01818 Encounter for other preprocedural examination: Secondary | ICD-10-CM | POA: Diagnosis not present

## 2016-11-22 DIAGNOSIS — N859 Noninflammatory disorder of uterus, unspecified: Secondary | ICD-10-CM | POA: Diagnosis not present

## 2016-11-27 NOTE — H&P (Signed)
31yo G0 who presents for scheduled hysteroscopy, D&C on due to postcoital bleeding uterine polyp. In review, postcoital bleeding has been an ongoing issue- last seen for this on 11/29/2015. About 75% of the time after IC she will have spotting that will usually last for at least a day or so. An SHG was performed and showed the following: SHG: 6.9cm anteverted uterus- normal size and shape. Hypoechoic mass ant. fundal wall measuring 1x0.80 submucosal fibriod? No other abnormalities seen. Normal ovaries. After discussion regarding risk/benefit and alternatives, pt wishes to proceed with surgical management. Currently on OCPs for contraceptions. Menses regular each month- denies HMB or dysmenorrhea. no other acute complaints or changes since her prior visit.      ROS:  CONSTITUTIONAL:  no Appetite changes. no Chills. no Fatigue. no Fever.  CARDIOLOGY:  no Chest pain.  RESPIRATORY:  no Shortness of breath. no Cough.  UROLOGY:  no Dysuria. no Urinary frequency. no Urinary incontinence. no Urinary urgency.  GASTROENTEROLOGY:  no Abdominal pain. no Change in bowel habits. no Change in bowel movements.  FEMALE REPRODUCTIVE:  no Breast lumps or discharge. no Breast pain. no Hot flashes. no Vaginal irritation. no Vaginal itching.  NEUROLOGY:  no Dizziness. no Headache.  PSYCHOLOGY:  no Anxiety. no Depression.  SKIN:  no Rash. no Hives.  HEMATOLOGY/LYMPH:  no Anemia. Using Blood Thinners no.         Medical History: Subclinical hyperthyroidism, Endo- [facility] , HTN.         Gyn History:  Sexual activity currently sexually active.  Periods : every month.  LMP 11/09/2016.  Birth control ocp.  Last pap smear date 06/08/2015 Neg.  Denies H/O Last mammogram date.        OB History:  Never been pregnant per patient.        Surgical History: Wisdom teeth extraction , Gum Transplant .        Family History: Father: alive, diagnosed with Hypertension. Mother: alive,  diagnosed with Breast cancer.  No known history of blood disorders in family.       Social History:  General:  Tobacco use  cigarettes: Never smoked Tobacco history last updated 11/22/2016 Additional Findings: Tobacco Non-User Non-smoker for personal reasons no EXPOSURE TO PASSIVE SMOKE.  Alcohol: yes, occasionally.  Caffeine: yes, occasionally.  no Recreational drug use.  Marital Status: single.  Children: none.  OCCUPATION: RN [employer].  COMMUNICATION BARRIERS: none.        Medications: Taking Biotin 1000 MCG Tablet Orally Once a day, Taking Tylenol , Notes: as needed, Taking Probiotic - Capsule Orally , Taking Junel FE 1.5/30(Norethin Ace-Eth Estrad-FE) 1.5-30 MG-MCG Tablet 1 tablet Orally Once a day, Taking Hydrochlorothiazide-12.5 mg 12.5 MG Tablet one tablet Orally once a day, Notes: Need ov with Dr. Jerilee Field for add'l refills, Taking Iron 325 (65 Fe) MG Tablet 1 tablet Orally Once a day, Discontinued Metronidazole 500 MG Tablet 1 tablet Orally Twice a day, Medication List reviewed and reconciled with the patient       Allergies: N.K.D.A.      Objective: examination performed in office     Vitals: Wt 154, Wt change -1 lb, Ht 64, BMI 26.43, Pulse sitting 101, BP sitting 121/90.       Examination:  General Examination:  GENERAL APPEARANCE well developed, well nourished .  SKIN: warm and dry, no rashes .  NECK: supple, normal appearance .  LUNGS: clear to auscultation bilaterally, no wheezes, rhonchi, rales.  HEART: no murmurs, regular  rate and rhythm.  ABDOMEN: soft and not tender, no masses palpated, no rebound, no rigidity.  MUSCULOSKELETAL no calf tenderness bilaterally .  EXTREMITIES: no edema present .  NEUROLOGIC EXAM: alert and oriented x 3.  PSYCH: appropriate mood and affect .     A/P: 31yo G0 who presents for hysteroscopy, D&C, possible polypectomy -NPO -LR @ 125cc/hr -CBC, BMP to be drawn -SCDs to OR -Risk/benefit reviewed including but not  limited to risk of bleeding, infection and uterine perforation. Questions and concerns were reviewed and pt wishes to proceed.  Janyth Pupa, DO [phone] (pager) [phone] (office)

## 2016-11-27 NOTE — Patient Instructions (Addendum)
Your procedure is scheduled on:  Friday, August 3  Enter through the [employer] [facility] at:  11 am  Pick up the phone at the desk and dial [phone].  Call this number if you have problems the morning of surgery: [phone].  Remember: Do NOT eat food after or drink clear liquids (including water) after Midnight Thursday  Take these medicines the morning of surgery with a SIP OF WATER:  None  Stop herbal medications and supplements at this time.  Do NOT wear jewelry (body piercing), metal hair clips/bobby pins, make-up, or nail polish. Do NOT wear lotions, powders, or perfumes.  You may wear deoderant. Do NOT shave for 48 hours prior to surgery. Do NOT bring valuables to the hospital.  Have a responsible adult drive you home and stay with you for 24 hours after your procedure.  Home with boyfriend Nicole Mccall Kitchen cell [phone].

## 2016-11-28 ENCOUNTER — Encounter (HOSPITAL_COMMUNITY)
Admission: RE | Admit: 2016-11-28 | Discharge: 2016-11-28 | Disposition: A | Discharge status: Home / Self Care | Payer: 59 | Source: Ambulatory Visit | Attending: Obstetrics & Gynecology | Admitting: Obstetrics & Gynecology

## 2016-11-28 ENCOUNTER — Encounter (HOSPITAL_COMMUNITY): Payer: Self-pay

## 2016-11-28 ENCOUNTER — Other Ambulatory Visit: Payer: Self-pay

## 2016-11-28 DIAGNOSIS — Z01812 Encounter for preprocedural laboratory examination: Secondary | ICD-10-CM | POA: Insufficient documentation

## 2016-11-28 DIAGNOSIS — N859 Noninflammatory disorder of uterus, unspecified: Secondary | ICD-10-CM | POA: Diagnosis not present

## 2016-11-28 DIAGNOSIS — I10 Essential (primary) hypertension: Secondary | ICD-10-CM | POA: Diagnosis not present

## 2016-11-28 HISTORY — DX: Gastro-esophageal reflux disease without esophagitis: K21.9

## 2016-11-28 HISTORY — DX: Anemia, unspecified: D64.9

## 2016-11-28 LAB — BASIC METABOLIC PANEL
ANION GAP: 8 (ref 5–15)
BUN: 17 mg/dL (ref 6–20)
CALCIUM: 9.5 mg/dL (ref 8.9–10.3)
CO2: 25 mmol/L (ref 22–32)
Chloride: 104 mmol/L (ref 101–111)
Creatinine, Ser: 0.82 mg/dL (ref 0.44–1.00)
GFR calc Af Amer: >60 mL/min (ref >60)
GFR calc non Af Amer: >60 mL/min (ref >60)
GLUCOSE: 89 mg/dL (ref 65–99)
Potassium: 4 mmol/L (ref 3.5–5.1)
Sodium: 137 mmol/L (ref 135–145)

## 2016-11-28 LAB — CBC
HCT: 39.9 % (ref 36.0–46.0)
Hemoglobin: 14.4 g/dL (ref 12.0–15.0)
MCH: 30.9 pg (ref 26.0–34.0)
MCHC: 36.1 g/dL — ABNORMAL HIGH (ref 30.0–36.0)
MCV: 85.6 fL (ref 78.0–100.0)
PLATELETS: 289 10*3/uL (ref 150–400)
RBC: 4.66 MIL/uL (ref 3.87–5.11)
RDW: 13.3 % (ref 11.5–15.5)
WBC: 10.2 10*3/uL (ref 4.0–10.5)

## 2016-12-05 MED FILL — metroNIDAZOLE 500 MG TABS: 500 | 7 days supply | Qty: 14 | Fill #2

## 2016-12-05 MED FILL — HYDROCHLOROTHIAZIDE 12.5 MG: 12.5 | 90 days supply | Qty: 90 | Fill #1

## 2016-12-06 ENCOUNTER — Ambulatory Visit (HOSPITAL_COMMUNITY): Payer: 59 | Admitting: Anesthesiology

## 2016-12-06 ENCOUNTER — Ambulatory Visit (HOSPITAL_COMMUNITY)
Admission: RE | Admit: 2016-12-06 | Discharge: 2016-12-06 | Disposition: A | Discharge status: Home / Self Care | Payer: 59 | Source: Ambulatory Visit | Attending: Obstetrics & Gynecology | Admitting: Obstetrics & Gynecology

## 2016-12-06 ENCOUNTER — Encounter (HOSPITAL_COMMUNITY)
Admission: RE | Disposition: A | Discharge status: Home / Self Care | Payer: Self-pay | Source: Ambulatory Visit | Attending: Obstetrics & Gynecology

## 2016-12-06 ENCOUNTER — Ambulatory Visit (HOSPITAL_COMMUNITY): Payer: 59

## 2016-12-06 ENCOUNTER — Encounter (HOSPITAL_COMMUNITY): Payer: Self-pay

## 2016-12-06 DIAGNOSIS — E0580 Other thyrotoxicosis without thyrotoxic crisis or storm: Secondary | ICD-10-CM | POA: Insufficient documentation

## 2016-12-06 DIAGNOSIS — Z803 Family history of malignant neoplasm of breast: Secondary | ICD-10-CM | POA: Diagnosis not present

## 2016-12-06 DIAGNOSIS — N939 Abnormal uterine and vaginal bleeding, unspecified: Secondary | ICD-10-CM | POA: Diagnosis present

## 2016-12-06 DIAGNOSIS — N856 Intrauterine synechiae: Secondary | ICD-10-CM | POA: Diagnosis not present

## 2016-12-06 DIAGNOSIS — Z8249 Family history of ischemic heart disease and other diseases of the circulatory system: Secondary | ICD-10-CM | POA: Insufficient documentation

## 2016-12-06 DIAGNOSIS — N736 Female pelvic peritoneal adhesions (postinfective): Secondary | ICD-10-CM | POA: Insufficient documentation

## 2016-12-06 DIAGNOSIS — I10 Essential (primary) hypertension: Secondary | ICD-10-CM | POA: Insufficient documentation

## 2016-12-06 DIAGNOSIS — N930 Postcoital and contact bleeding: Secondary | ICD-10-CM | POA: Insufficient documentation

## 2016-12-06 DIAGNOSIS — R58 Hemorrhage, not elsewhere classified: Secondary | ICD-10-CM

## 2016-12-06 HISTORY — PX: HYSTEROSCOPY: SHX211

## 2016-12-06 SURGERY — HYSTEROSCOPY
Anesthesia: General | Site: Vagina

## 2016-12-06 MED ORDER — ONDANSETRON HCL 4 MG/2ML IJ SOLN
INTRAMUSCULAR | Status: DC | PRN
  Administered 2016-12-06: 4 mg via INTRAVENOUS

## 2016-12-06 MED ORDER — LIDOCAINE HCL (CARDIAC) 20 MG/ML IV SOLN
INTRAVENOUS | Status: DC | PRN
  Administered 2016-12-06: 60 mg via INTRAVENOUS

## 2016-12-06 MED ORDER — FENTANYL CITRATE (PF) 100 MCG/2ML IJ SOLN
INTRAMUSCULAR | Status: AC
  Filled 2016-12-06: qty 2

## 2016-12-06 MED ORDER — LACTATED RINGERS IV SOLN
INTRAVENOUS | Status: DC
  Administered 2016-12-06 (×2): via INTRAVENOUS

## 2016-12-06 MED ORDER — ACETAMINOPHEN 160 MG/5ML PO SOLN
975.0000 mg | Freq: Four times a day (QID) | ORAL | Status: AC | PRN
  Administered 2016-12-06: 975 mg via ORAL

## 2016-12-06 MED ORDER — KETOROLAC TROMETHAMINE 30 MG/ML IJ SOLN
INTRAMUSCULAR | Status: DC | PRN
  Administered 2016-12-06: 30 mg via INTRAVENOUS

## 2016-12-06 MED ORDER — MIDAZOLAM HCL 2 MG/2ML IJ SOLN
INTRAMUSCULAR | Status: DC | PRN
  Administered 2016-12-06: 2 mg via INTRAVENOUS

## 2016-12-06 MED ORDER — FENTANYL CITRATE (PF) 100 MCG/2ML IJ SOLN: 25.0000 ug | INTRAMUSCULAR | Status: DC | PRN

## 2016-12-06 MED ORDER — DEXAMETHASONE SODIUM PHOSPHATE 4 MG/ML IJ SOLN
INTRAMUSCULAR | Status: DC | PRN
  Administered 2016-12-06: 10 mg via INTRAVENOUS

## 2016-12-06 MED ORDER — PROPOFOL 10 MG/ML IV BOLUS
INTRAVENOUS | Status: AC
Start: 2016-12-06 — End: 2016-12-06
  Filled 2016-12-06: qty 40

## 2016-12-06 MED ORDER — FENTANYL CITRATE (PF) 250 MCG/5ML IJ SOLN
INTRAMUSCULAR | Status: DC | PRN
  Administered 2016-12-06: 50 ug via INTRAVENOUS
  Administered 2016-12-06: 100 ug via INTRAVENOUS
  Administered 2016-12-06: 50 ug via INTRAVENOUS

## 2016-12-06 MED ORDER — LIDOCAINE HCL (CARDIAC) 20 MG/ML IV SOLN
INTRAVENOUS | Status: AC
  Filled 2016-12-06: qty 5

## 2016-12-06 MED ORDER — MIDAZOLAM HCL 2 MG/2ML IJ SOLN
INTRAMUSCULAR | Status: AC
  Filled 2016-12-06: qty 2

## 2016-12-06 MED ORDER — GLYCOPYRROLATE 0.2 MG/ML IJ SOLN
INTRAMUSCULAR | Status: AC
  Filled 2016-12-06: qty 1

## 2016-12-06 MED ORDER — PROPOFOL 10 MG/ML IV BOLUS
INTRAVENOUS | Status: DC | PRN
  Administered 2016-12-06: 200 mg via INTRAVENOUS

## 2016-12-06 MED ORDER — SCOPOLAMINE 1 MG/3DAYS TD PT72
1.0000 | MEDICATED_PATCH | Freq: Once | TRANSDERMAL | Status: DC
  Administered 2016-12-06: 1.5 mg via TRANSDERMAL

## 2016-12-06 MED ORDER — SCOPOLAMINE 1 MG/3DAYS TD PT72
MEDICATED_PATCH | TRANSDERMAL | Status: AC
  Administered 2016-12-06: 1.5 mg via TRANSDERMAL
  Filled 2016-12-06: qty 1

## 2016-12-06 MED ORDER — SODIUM CHLORIDE 0.9 % IR SOLN
Status: DC | PRN
  Administered 2016-12-06: 3000 mL

## 2016-12-06 MED ORDER — FENTANYL CITRATE (PF) 100 MCG/2ML IJ SOLN
INTRAMUSCULAR | Status: AC
  Filled 2016-12-06: qty 4

## 2016-12-06 MED ORDER — ACETAMINOPHEN 160 MG/5ML PO SOLN
ORAL | Status: AC
  Administered 2016-12-06: 975 mg via ORAL
  Filled 2016-12-06: qty 40.6

## 2016-12-06 MED ORDER — ONDANSETRON HCL 4 MG/2ML IJ SOLN
INTRAMUSCULAR | Status: AC
  Filled 2016-12-06: qty 2

## 2016-12-06 MED ORDER — DEXAMETHASONE SODIUM PHOSPHATE 10 MG/ML IJ SOLN
INTRAMUSCULAR | Status: AC
  Filled 2016-12-06: qty 1

## 2016-12-06 SURGICAL SUPPLY — 19 items
CANISTER SUCT 3000ML PPV (MISCELLANEOUS) ×3 IMPLANT
CATH ROBINSON RED A/P 16FR (CATHETERS) ×3 IMPLANT
CLOTH BEACON ORANGE TIMEOUT ST (SAFETY) ×3 IMPLANT
CONTAINER PREFILL 10% NBF 60ML (FORM) ×6 IMPLANT
DEVICE MYOSURE LITE (MISCELLANEOUS) IMPLANT
DEVICE MYOSURE REACH (MISCELLANEOUS) IMPLANT
DILATOR CANAL MILEX (MISCELLANEOUS) ×3 IMPLANT
GLOVE BIOGEL PI IND STRL 6.5 (GLOVE) ×2 IMPLANT
GLOVE BIOGEL PI IND STRL 7.0 (GLOVE) ×2 IMPLANT
GLOVE BIOGEL PI INDICATOR 6.5 (GLOVE) ×1
GLOVE BIOGEL PI INDICATOR 7.0 (GLOVE) ×1
GLOVE ECLIPSE 6.5 STRL STRAW (GLOVE) ×3 IMPLANT
GOWN STRL REUS W/TWL LRG LVL3 (GOWN DISPOSABLE) ×6 IMPLANT
PACK VAGINAL MINOR WOMEN LF (CUSTOM PROCEDURE TRAY) ×3 IMPLANT
PAD OB MATERNITY 4.3X12.25 (PERSONAL CARE ITEMS) ×3 IMPLANT
SEAL ROD LENS SCOPE MYOSURE (ABLATOR) ×3 IMPLANT
TOWEL OR 17X24 6PK STRL BLUE (TOWEL DISPOSABLE) ×6 IMPLANT
TUBING AQUILEX INFLOW (TUBING) ×3 IMPLANT
TUBING AQUILEX OUTFLOW (TUBING) ×3 IMPLANT

## 2016-12-06 NOTE — Anesthesia Procedure Notes (Signed)
Procedure Name: LMA Insertion Date/Time: 12/06/2016 12:02 PM Performed by: Jonna Munro Pre-anesthesia Checklist: Patient identified, Emergency Drugs available, Suction available, Patient being monitored and Timeout performed Patient Re-evaluated:Patient Re-evaluated prior to induction Oxygen Delivery Method: Circle system utilized Preoxygenation: Pre-oxygenation with 100% oxygen LMA: LMA inserted LMA Size: 4.0 Number of attempts: 1 Placement Confirmation: positive ETCO2 and breath sounds checked- equal and bilateral Tube secured with: Tape Dental Injury: Teeth and Oropharynx as per pre-operative assessment

## 2016-12-06 NOTE — Anesthesia Preprocedure Evaluation (Signed)
Anesthesia Evaluation  Patient identified by MRN, date of birth, ID band Patient awake    Reviewed: Allergy & Precautions, H&P , Patient's Chart, lab work & pertinent test results, reviewed documented beta blocker date and time   Airway Mallampati: II  TM Distance: >3 FB Neck ROM: full    Dental no notable dental hx.    Pulmonary    Pulmonary exam normal breath sounds clear to auscultation       Cardiovascular hypertension,  Rhythm:regular Rate:Normal     Neuro/Psych    GI/Hepatic   Endo/Other    Renal/GU      Musculoskeletal   Abdominal   Peds  Hematology   Anesthesia Other Findings   Reproductive/Obstetrics                             Anesthesia Physical Anesthesia Plan  ASA: II  Anesthesia Plan: General   Post-op Pain Management:    Induction: Intravenous  PONV Risk Score and Plan:   Airway Management Planned: LMA  Additional Equipment:   Intra-op Plan:   Post-operative Plan:   Informed Consent: I have reviewed the patients History and Physical, chart, labs and discussed the procedure including the risks, benefits and alternatives for the proposed anesthesia with the patient or authorized representative who has indicated his/her understanding and acceptance.   Dental Advisory Given  Plan Discussed with: CRNA and Surgeon  Anesthesia Plan Comments: ( )        Anesthesia Quick Evaluation  

## 2016-12-06 NOTE — Transfer of Care (Signed)
Immediate Anesthesia Transfer of Care Note  Patient: Nicole Mccall  Procedure(s) Performed: Procedure(s): HYSTEROSCOPY (N/A)  Patient Location: PACU  Anesthesia Type:General  Level of Consciousness: awake, alert  and oriented  Airway & Oxygen Therapy: Patient Spontanous Breathing and Patient connected to nasal cannula oxygen  Post-op Assessment: Report given to RN and Post -op Vital signs reviewed and stable  Post vital signs: Reviewed and stable  Last Vitals:  Vitals:   12/06/16 1106  BP: (!) 131/99  Pulse: 75  Resp: 16  Temp: 36.7 C    Last Pain:  Vitals:   12/06/16 1106  TempSrc: Oral      Patients Stated Pain Goal: 2 (29/56/21 3086)  Complications: No apparent anesthesia complications

## 2016-12-06 NOTE — Discharge Instructions (Addendum)
HOME INSTRUCTIONS  Please note any unusual or excessive bleeding, pain, swelling. Mild dizziness or drowsiness are normal for about 24 hours after surgery.   Shower when comfortable  Restrictions: No driving for 24 hours or while taking pain medications.  Activity:  No heavy lifting (> 10 lbs), nothing in vagina (no tampons, douching, or intercourse) x 2 weeks; no tub baths for 2 weeks Vaginal spotting is expected but if your bleeding is heavy, period like,  please call the office  Diet:  You may return to your regular diet.  Do not eat large meals.  Eat small frequent meals throughout the day.  Continue to drink a good amount of water at least 6-8 glasses of water per day, hydration is very important for the healing process.  Pain Management: Take Motrin and/or Tylenol as prescribed/needed for pain.  Always take prescription pain medication with food, it may cause constipation, increase fluids and fiber and you may want to take an over-the-counter stool softener like Colace as needed up to 2x a day.    Alcohol -- Avoid for 24 hours and while taking pain medications.  Nausea: Take sips of ginger ale or soda  Fever -- Call physician if temperature over 101 degrees  Follow up:  If you do not already have a follow up appointment scheduled, please call the office at [phone].  If you experience fever (a temperature greater than 100.4), pain unrelieved by pain medication, shortness of breath, swelling of a single leg, or any other symptoms which are concerning to you please the office immediately.    Post Anesthesia Home Care Instructions  Activity: Get plenty of rest for the remainder of the day. A responsible individual must stay with you for 24 hours following the procedure.  For the next 24 hours, DO NOT: -Drive a car -Paediatric nurse -Drink alcoholic beverages -Take any medication unless instructed by your physician -Make any legal decisions or sign important  papers.  Meals: Start with liquid foods such as gelatin or soup. Progress to regular foods as tolerated. Avoid greasy, spicy, heavy foods. If nausea and/or vomiting occur, drink only clear liquids until the nausea and/or vomiting subsides. Call your physician if vomiting continues.  Special Instructions/Symptoms: Your throat may feel dry or sore from the anesthesia or the breathing tube placed in your throat during surgery. If this causes discomfort, gargle with warm salt water. The discomfort should disappear within 24 hours.  If you had a scopolamine patch placed behind your ear for the management of post- operative nausea and/or vomiting:  1. The medication in the patch is effective for 72 hours, after which it should be removed.  Wrap patch in a tissue and discard in the trash. Wash hands thoroughly with soap and water. 2. You may remove the patch earlier than 72 hours if you experience unpleasant side effects which may include dry mouth, dizziness or visual disturbances. 3. Avoid touching the patch. Wash your hands with soap and water after contact with the patch.     NO IBUPROFEN PRODUCTS (MOTRIN, ADVIL) OR ALEVE UNTIL 6:30PM TODAY.

## 2016-12-06 NOTE — Op Note (Signed)
Operative Report  PreOp: Postcoital bleeding, Uterine mass PostOp: Uterine adhesions Procedure:  Hysteroscopy, Dilation and Curettage, Endometrial ablation Surgeon: Dr. Janyth Pupa Anesthesia: General Complications:none EBL: 5cc UOP: 100cc IVF:800cc  Discrepancy: 200cc  Findings: 7cm anteverted uterus, scar tissue noted within endometrium, discrete cavity not well identified  Specimens: none  Procedure: The patient was taken to the operating room where she underwent general anesthesia without difficulty. The patient was placed in a low lithotomy position using Allen stirrups. The patient was examined with the findings as noted above.  She was then prepped and draped in the normal sterile fashion. The bladder was drained using a red rubber urethral catheter. A sterile speculum was inserted into the vagina. A single tooth tenaculum was placed on the anterior lip of the cervix. Os finder was used to dilate and then Hank dilators were used without complications.  The diagnostic hysteroscope was then inserted to ~ 5-6cm and no discrete cavity was seen.  Along with lower segment walls both filmy and dense adhesions/thickening appreciated.  At that time, ultrasound was called and used to better assess location of the hysteroscope and proper location was confirmed.  Ostia visualized and uterine back wall not well identified.  Curettage was not performed as no discrete mass was noted. Visualization was achieved using NS as a distending medium.  All instrument were removed, silver nitrate was used to obtain hemostasis  at the cervical site.  The patient was repositioned to the supine position. The patient tolerated the procedure without any complications and taken to recovery in stable condition.   Janyth Pupa, DO [phone] (pager) [phone] (office)

## 2016-12-06 NOTE — Interval H&P Note (Signed)
History and Physical Interval Note:  12/06/2016 11:46 AM  Nicole Mccall  has presented today for surgery, with the diagnosis of N85.9 Uterine Mass  The various methods of treatment have been discussed with the patient and family. After consideration of risks, benefits and other options for treatment, the patient has consented to  Procedure(s) with comments: [place] (N/A) - Polypectomy as a surgical intervention .  The patient's history has been reviewed, patient examined, no change in status, stable for surgery.  I have reviewed the patient's chart and labs.  Questions were answered to the patient's satisfaction.     Janyth Pupa, M

## 2016-12-11 NOTE — Anesthesia Postprocedure Evaluation (Signed)
Anesthesia Post Note  Patient: Nicole Mccall  Procedure(s) Performed: Procedure(s) (LRB): HYSTEROSCOPY (N/A)     Patient location during evaluation: PACU Anesthesia Type: General Level of consciousness: awake and alert Pain management: pain level controlled Vital Signs Assessment: post-procedure vital signs reviewed and stable Respiratory status: spontaneous breathing, nonlabored ventilation, respiratory function stable and patient connected to nasal cannula oxygen Cardiovascular status: blood pressure returned to baseline and stable Postop Assessment: no signs of nausea or vomiting Anesthetic complications: no    Last Vitals:  Vitals:   12/06/16 1415 12/06/16 1455  BP: 121/89 118/78  Pulse: 94 88  Resp: (!) 21 20  Temp: 36.9 C     Last Pain:  Vitals:   12/06/16 1330  TempSrc:   PainSc: 3    Pain Goal: Patients Stated Pain Goal: 2 (12/06/16 1106)               Lyndle Herrlich EDWARD

## 2016-12-12 ENCOUNTER — Encounter (HOSPITAL_COMMUNITY): Payer: Self-pay | Admitting: Obstetrics & Gynecology

## 2017-01-03 DIAGNOSIS — Z4889 Encounter for other specified surgical aftercare: Secondary | ICD-10-CM | POA: Diagnosis not present

## 2017-01-03 DIAGNOSIS — N930 Postcoital and contact bleeding: Secondary | ICD-10-CM | POA: Diagnosis not present

## 2017-01-30 MED FILL — LARIN FE 1.5-30 TABLET: 1.5-30 | 84 days supply | Qty: 84 | Fill #3

## 2017-02-05 ENCOUNTER — Encounter: Payer: Self-pay | Admitting: Physician Assistant

## 2017-02-05 ENCOUNTER — Ambulatory Visit (INDEPENDENT_AMBULATORY_CARE_PROVIDER_SITE_OTHER): Payer: 59

## 2017-02-05 ENCOUNTER — Ambulatory Visit (INDEPENDENT_AMBULATORY_CARE_PROVIDER_SITE_OTHER): Payer: 59 | Admitting: Physician Assistant

## 2017-02-05 VITALS — BP 118/88 | HR 76 | Temp 98.2°F | Resp 16 | Ht 63.0 in | Wt 152.4 lb

## 2017-02-05 DIAGNOSIS — M79645 Pain in left finger(s): Secondary | ICD-10-CM

## 2017-02-05 DIAGNOSIS — S62522A Displaced fracture of distal phalanx of left thumb, initial encounter for closed fracture: Secondary | ICD-10-CM | POA: Diagnosis not present

## 2017-02-05 DIAGNOSIS — S62502A Fracture of unspecified phalanx of left thumb, initial encounter for closed fracture: Secondary | ICD-10-CM | POA: Diagnosis not present

## 2017-02-05 NOTE — Patient Instructions (Addendum)
You can ice the hand, but should stay in the thumb spica.    I am referring you to a hand specialist.   You can take tylenol for your pain.    IF you received an x-ray today, you will receive an invoice from [facility]. Please contact [facility] at [phone] with questions or concerns regarding your invoice.   IF you received labwork today, you will receive an invoice from [facility]. Please contact [facility] at [phone] with questions or concerns regarding your invoice.   Our billing staff will not be able to assist you with questions regarding bills from these companies.  You will be contacted with the lab results as soon as they are available. The fastest way to get your results is to activate your My Chart account. Instructions are located on the last page of this paperwork. If you have not heard from [place] regarding the results in 2 weeks, please contact this office.

## 2017-02-05 NOTE — Progress Notes (Signed)
PRIMARY CARE AT [facility] [address], [place] [address] [phone]  Date:  02/05/2017   Name:  Nicole Mccall   DOB:  June 11, 1985   MRN:  [id number]  PCP:  Leeroy Cha, MD    History of Present Illness:  Nicole Mccall is a 31 y.o. female patient who presents to PCP with  Chief Complaint  Patient presents with  . Fall    roller skating [place] and fell and hurt left thumb, hurts to bend  heard a pop      Patient was roller skating 4 days ago when she fell on her buttocks.  As she lifted up she felt thumb slide down and a pop.  Pain was immediate.  Hours later, she had increased swelling of her hand.  Difficult to move the thumb.  Some tingling along the left side of thumb.  She has not done anything for the pain.  Non-dominant hand.  No prior injury.    Patient Active Problem List   Diagnosis Date Noted  . Benign essential HTN 04/16/2016  . 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 Anemia   . Anxiety    no meds  . GERD (gastroesophageal reflux disease)    diet controlled - no med  . Hypertension     Past Surgical History:  Procedure Laterality Date  . HYSTEROSCOPY N/A 12/06/2016   Procedure: HYSTEROSCOPY;  Surgeon: Janyth Pupa, DO;  Location: [facility];  Service: Gynecology;  Laterality: N/A;  . WISDOM TOOTH EXTRACTION      Social History  Substance Use Topics  . Smoking status: Never Smoker  . Smokeless tobacco: Never Used  . Alcohol use No    Family History  Problem Relation Age of Onset  . Cancer Mother   . Cancer Father   . Hyperlipidemia Father   . Diabetes Maternal Grandmother   . Stroke Maternal Grandmother   . Hypertension Maternal Grandfather   . Mental illness Maternal Grandfather     No Known Allergies  Medication list has been reviewed and updated.  Current Outpatient Prescriptions on File Prior to Visit  Medication Sig Dispense Refill  . Biotin w/ Vitamins C & E (HAIR/SKIN/NAILS PO) Take 1  tablet by mouth daily.    . ferrous sulfate 325 (65 FE) MG tablet Take 325 mg by mouth daily with breakfast.    . hydrochlorothiazide (MICROZIDE) 12.5 MG capsule Take 12.5 mg by mouth daily.    Marland Kitchen ibuprofen (ADVIL,MOTRIN) 200 MG tablet Take 400-600 mg by mouth every 8 (eight) hours as needed for mild pain or moderate pain.    . Multiple Vitamins-Minerals (MULTIVITAMIN WITH MINERALS) tablet Take 1 tablet by mouth daily.    . norethindrone-ethinyl estradiol (JUNEL FE,GILDESS FE,LOESTRIN FE) 1-20 MG-MCG tablet Take 1 tablet by mouth daily.    . Probiotic Product (PROBIOTIC DAILY PO) Take 1 capsule by mouth daily.     No current facility-administered medications on file prior to visit.     ROS ROS otherwise unremarkable unless listed above.  Physical Examination: BP 118/88   Pulse 76   Temp 98.2 F (36.8 C)   Resp 16   Ht 5\' 3"  (1.6 m)   Wt 152 lb 6.4 oz (69.1 kg)   LMP 02/04/2017   SpO2 99%   BMI 27.00 kg/m  Ideal Body Weight: Weight in (lb) to have BMI = 25: 140.8  Physical Exam  Constitutional: She is oriented to person, place, and time. She  appears well-developed and well-nourished. No distress.  Eyes: EOM are normal.  Cardiovascular: Normal rate.   Pulmonary/Chest: Effort normal. No respiratory distress.  Musculoskeletal:       Left hand: She exhibits decreased range of motion, tenderness, bony tenderness and swelling. Decreased strength noted. She exhibits thumb/finger opposition.  Thumb swelling along the 1st metacarpal joint and medial swelling that is tender.  No erythema.  Decreased strength and rom.  No tenderness in the scaphoid region.   Neurological: She is alert and oriented to person, place, and time.  Skin: She is not diaphoretic.  Psychiatric: She has a normal mood and affect. Her behavior is normal.    Dg Finger Thumb Left  Result Date: 02/05/2017 CLINICAL DATA:  Pain following fall EXAM: LEFT THUMB 2+V COMPARISON:  None. FINDINGS: Frontal, oblique, and  lateral views were obtained. A tiny calcification is noted medial to the proximal most aspect of the first proximal phalanx. Suspect tiny avulsion. No other evidence suggesting fracture. No dislocation. Joint spaces appear normal. No erosive change. IMPRESSION: Tiny avulsion medial to the proximal aspect of the first proximal phalanx. No other fracture. No dislocation. No evident arthropathy. These results will be called to the ordering clinician or representative by the Radiologist Assistant, and communication documented in the PACS or zVision Dashboard. Electronically Signed   By: Lowella Grip III M.D.   On: 02/05/2017 11:16     Assessment and Plan: Nicole Mccall is a 31 y.o. female who is here today for cc of  Chief Complaint  Patient presents with  . Fall    roller skating [place] and fell and hurt left thumb, hurts to bend  heard a pop   thumb spica splint.  Advised icing.  Referring to hand surgeon as this is a hand fracture, though this likely will be observed with repeat xray.  Closed avulsion fracture of phalanx of left thumb, initial encounter - Plan: AMB referral to orthopedics  Thumb pain, left - Plan: DG Finger Thumb Left, AMB referral to orthopedics  Ivar Drape, PA-C [facility] 10/7/20189:54 AM

## 2017-02-11 DIAGNOSIS — H5203 Hypermetropia, bilateral: Secondary | ICD-10-CM | POA: Diagnosis not present

## 2017-02-11 DIAGNOSIS — H52223 Regular astigmatism, bilateral: Secondary | ICD-10-CM | POA: Diagnosis not present

## 2017-02-17 ENCOUNTER — Ambulatory Visit (INDEPENDENT_AMBULATORY_CARE_PROVIDER_SITE_OTHER): Payer: 59 | Admitting: Orthopaedic Surgery

## 2017-02-17 ENCOUNTER — Encounter (INDEPENDENT_AMBULATORY_CARE_PROVIDER_SITE_OTHER): Payer: Self-pay | Admitting: Orthopaedic Surgery

## 2017-02-17 DIAGNOSIS — M79645 Pain in left finger(s): Secondary | ICD-10-CM | POA: Diagnosis not present

## 2017-02-17 NOTE — Progress Notes (Signed)
Office Visit Note   Patient: Nicole Mccall           Date of Birth: 10/21/1985           MRN: [id number] Visit Date: 02/17/2017              Requested by: Leeroy Cha, MD [address] [place], [place] [address] PCP: Leeroy Cha, MD   Assessment & Plan: Visit Diagnoses:  1. Pain of left thumb     Plan: Patient has avulsion fracture of the base of the proximal phalanx with at least a sprain of the ulnar collateral ligament possibly a tear. An MRI of the left thumb to evaluate for stener lesion which would necessitate surgery. Thumb spica brace for now. Questions encouraged and answered. Work note provided today.  Follow-Up Instructions: Return in about 10 days (around 02/27/2017).   Orders:  Orders Placed This Encounter  Procedures  . MR FINGERS LEFT WO CONTRAST   No orders of the defined types were placed in this encounter.     Procedures: No procedures performed   Clinical Data: No additional findings.   Subjective: Chief Complaint  Patient presents with  . 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 She had a mechanical fall in which she fell on her left thumb and this was bent backwards. She has difficulty with doing certain movements with her left hand. Her pain is localized to the MCP joint of the left thumb. Denies any numbness and tingling. She has been wearing a thumb spica brace since the injury. Pain does not radiate.    Review of Systems  Constitutional: Negative.   HENT: Negative.   Eyes: Negative.   Respiratory: Negative.   Cardiovascular: Negative.   Endocrine: Negative.   Musculoskeletal: Negative.   Neurological: Negative.   Hematological: Negative.   Psychiatric/Behavioral: Negative.   All other systems reviewed and are  negative.    Objective: Vital Signs: LMP 02/04/2017   Physical Exam  Constitutional: She is oriented to person, place, and time. She appears well-developed and well-nourished.  HENT:  Head: Normocephalic and atraumatic.  Eyes: EOM are normal.  Neck: Neck supple.  Pulmonary/Chest: Effort normal.  Abdominal: Soft.  Neurological: She is alert and oriented to person, place, and time.  Skin: Skin is warm. Capillary refill takes less than 2 seconds.  Psychiatric: She has a normal mood and affect. Her behavior is normal. Judgment and thought content normal.  Nursing note and vitals reviewed.   Ortho Exam Left thumb exam shows a soft endpoint with ulnar collateral ligament testing of the accessory and proper ligament. There is slight increased opening with UCL testing compared to the contralateral side which measures approximately 5. Specialty Comments:  No specialty comments available.  Imaging: No results found.   PMFS History: Patient Active Problem List   Diagnosis Date Noted  . Benign essential HTN 04/16/2016  . Thyromegaly 05/29/2011   Past Medical History:  Diagnosis Date  . Anemia   . Anxiety    no meds  . GERD (gastroesophageal reflux disease)    diet controlled - no med  . Hypertension     Family History  Problem Relation Age of Onset  . Cancer Mother   . Cancer Father   . Hyperlipidemia Father   . Diabetes Maternal Grandmother   .  Stroke Maternal Grandmother   . Hypertension Maternal Grandfather   . Mental illness Maternal Grandfather     Past Surgical History:  Procedure Laterality Date  . HYSTEROSCOPY N/A 12/06/2016   Procedure: HYSTEROSCOPY;  Surgeon: Janyth Pupa, DO;  Location: [facility];  Service: Gynecology;  Laterality: N/A;  . WISDOM TOOTH EXTRACTION     Social History   Occupational History  . Not on file.   Social History Main Topics  . Smoking status: Never Smoker  . Smokeless tobacco: Never Used  . Alcohol use No  . Drug use: No  .  Sexual activity: Yes    Birth control/ protection: Pill

## 2017-02-28 ENCOUNTER — Ambulatory Visit
Admission: RE | Admit: 2017-02-28 | Discharge: 2017-02-28 | Disposition: A | Discharge status: Home / Self Care | Payer: 59 | Source: Ambulatory Visit | Attending: Orthopaedic Surgery | Admitting: Orthopaedic Surgery

## 2017-02-28 DIAGNOSIS — S6992XA Unspecified injury of left wrist, hand and finger(s), initial encounter: Secondary | ICD-10-CM | POA: Diagnosis not present

## 2017-02-28 DIAGNOSIS — M79645 Pain in left finger(s): Secondary | ICD-10-CM

## 2017-03-07 ENCOUNTER — Ambulatory Visit (INDEPENDENT_AMBULATORY_CARE_PROVIDER_SITE_OTHER): Payer: 59 | Admitting: Orthopaedic Surgery

## 2017-03-07 ENCOUNTER — Encounter (INDEPENDENT_AMBULATORY_CARE_PROVIDER_SITE_OTHER): Payer: Self-pay | Admitting: Orthopaedic Surgery

## 2017-03-07 DIAGNOSIS — S63642A Sprain of metacarpophalangeal joint of left thumb, initial encounter: Secondary | ICD-10-CM | POA: Insufficient documentation

## 2017-03-07 NOTE — Progress Notes (Signed)
Office Visit Note   Patient: Nicole Mccall           Date of Birth: 11/07/1985           MRN: [id number] Visit Date: 03/07/2017              Requested by: Leeroy Cha, MD [address] [place], [place] [address] PCP: Leeroy Cha, MD   Assessment & Plan: Visit Diagnoses:  1. Skier's thumb, left, initial encounter     Plan: MRI is consistent with a full tear of the ulnar collateral ligament without a stent or lesion.  This is now 68 weeks old and she is feeling much better.  The ligament appears to have healed well and I do not appreciate any laxity of the ligament.  I will refer her to hand therapy for splinting and for strengthening.  Questions encouraged and answered.  Follow-up in 6 weeks for recheck.  Follow-Up Instructions: Return in about 6 weeks (around 04/18/2017).   Orders:  No orders of the defined types were placed in this encounter.  No orders of the defined types were placed in this encounter.     Procedures: No procedures performed   Clinical Data: No additional findings.   Subjective: Chief Complaint  Patient presents with  . Left Thumb - Pain    Nicole Mccall follows up today to review her MRI.  Overall she is feeling slightly better.    Review of Systems  Constitutional: Negative.   HENT: Negative.   Eyes: Negative.   Respiratory: Negative.   Cardiovascular: Negative.   Endocrine: Negative.   Musculoskeletal: Negative.   Neurological: Negative.   Hematological: Negative.   Psychiatric/Behavioral: Negative.   All other systems reviewed and are negative.    Objective: Vital Signs: There were no vitals taken for this visit.  Physical Exam  Constitutional: She is oriented to person, place, and time. She appears well-developed and well-nourished.  Pulmonary/Chest: Effort normal.  Neurological: She is alert and oriented to person, place, and time.  Skin: Skin is warm. Capillary refill takes less than 2  seconds.  Psychiatric: She has a normal mood and affect. Her behavior is normal. Judgment and thought content normal.  Nursing note and vitals reviewed.   Ortho Exam Left thumb exam shows a firm endpoint with ulnar collateral ligament testing.  There is no significant discrepancy compared to the contralateral thumb. Specialty Comments:  No specialty comments available.  Imaging: No results found.   PMFS History: Patient Active Problem List   Diagnosis Date Noted  . Skier's thumb, left, initial encounter 03/07/2017  . Benign essential HTN 04/16/2016  . Thyromegaly 05/29/2011   Past Medical History:  Diagnosis Date  . Anemia   . Anxiety    no meds  . GERD (gastroesophageal reflux disease)    diet controlled - no med  . Hypertension     Family History  Problem Relation Age of Onset  . Cancer Mother   . Cancer Father   . Hyperlipidemia Father   . Diabetes Maternal Grandmother   . Stroke Maternal Grandmother   . Hypertension Maternal Grandfather   . Mental illness Maternal Grandfather     Past Surgical History:  Procedure Laterality Date  . HYSTEROSCOPY N/A 12/06/2016   Procedure: HYSTEROSCOPY;  Surgeon: Janyth Pupa, DO;  Location: [facility];  Service: Gynecology;  Laterality: N/A;  . WISDOM TOOTH EXTRACTION     Social History   Occupational History  . Not on file.  Social History Main Topics  . Smoking status: Never Smoker  . Smokeless tobacco: Never Used  . Alcohol use No  . Drug use: No  . Sexual activity: Yes    Birth control/ protection: Pill

## 2017-03-17 ENCOUNTER — Ambulatory Visit: Payer: 59 | Attending: Orthopaedic Surgery | Admitting: Occupational Therapy

## 2017-03-17 DIAGNOSIS — M6281 Muscle weakness (generalized): Secondary | ICD-10-CM | POA: Diagnosis not present

## 2017-03-17 DIAGNOSIS — M25642 Stiffness of left hand, not elsewhere classified: Secondary | ICD-10-CM | POA: Diagnosis not present

## 2017-03-17 DIAGNOSIS — M25542 Pain in joints of left hand: Secondary | ICD-10-CM | POA: Insufficient documentation

## 2017-03-17 NOTE — Therapy (Signed)
[facility] [address] [place], [place], [address] Phone: [phone]   Fax:  [phone]  Occupational Therapy Evaluation  Patient Details  Name: Nicole Mccall MRN: [id number] Date of Birth: 20-Feb-1986 Referring Provider: Dr. Frankey Shown   Encounter Date: 03/17/2017  OT End of Session - 03/17/17 1421    Visit Number  1    Number of Visits  12    Date for OT Re-Evaluation  05/15/17    Authorization Type  UMR    OT Start Time  1230    OT Stop Time  1315    OT Time Calculation (min)  45 min    Activity Tolerance  Patient tolerated treatment well    Behavior During Therapy  [facility] for tasks assessed/performed       Past Medical History:  Diagnosis Date  . Anemia   . Anxiety    no meds  . GERD (gastroesophageal reflux disease)    diet controlled - no med  . Hypertension     Past Surgical History:  Procedure Laterality Date  . WISDOM TOOTH EXTRACTION      There were no vitals filed for this visit.  Subjective Assessment - 03/17/17 1236    Pertinent History  Pt's injury to Lt thumb on 01/31/17 with avulsion fx and UCL tear, has had thumb spica splint on for approx. 6 weeks (provided by MD office) from eval date 03/17/17    Currently in Pain?  Yes    Pain Score  -- 0/10 at rest, 7/65 with certain movements    Pain Location  -- Lt thumb    Pain Orientation  Left    Pain Descriptors / Indicators  Aching;Sharp    Pain Type  Acute pain    Pain Onset  More than a month ago    Pain Frequency  Intermittent    Aggravating Factors   pull on thumb in abduction, certain movements    Pain Relieving Factors  rest        [facility] OT Assessment - 03/17/17 0001      Assessment   Diagnosis  Lt thumb UCL tear and avulsion fx    Referring Provider  Dr. Frankey Shown    Onset Date  01/31/17    Assessment  No surgery, arrived with pre-fab thumb spica splint which she has worn for approx. 6 weeks. except showering/eating  (provided by MD office).  Pt has referral for eval and treat (including: A/ROM, P/ROM, and strengthening) and hand based thermoplastic splint.     Prior Therapy  none for this episode      Precautions   Precautions  None      Balance Screen   Has the patient fallen in the past 6 months  Yes    How many times?  1    Has the patient had a decrease in activity level because of a fear of falling?   No    Is the patient reluctant to leave their home because of a fear of falling?   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       IADL   Shopping  Takes care of all shopping needs independently    Light Housekeeping  Performs light daily tasks but cannot maintain acceptable level of cleanliness modifications for washing dishes    Meal Prep  Plans, prepares and serves adequate meals independently with modifications    Medication Management  Is responsible for taking medication in correct dosages at correct time    Financial Management  Manages financial matters independently (budgets, writes checks, pays rent, bills goes to bank), collects and keeps track of income      Mobility   Mobility Status  Independent      Written Expression    Dominant Hand  Right      Observation/Other Assessments   Observations  Pt arrives with thumb spica pre-fab splint (provided by MD office).       Sensation   Light Touch  Appears Intact      Coordination   9 Hole Peg Test  Right;Left    Right 9 Hole Peg Test  18.56 sec    Left 9 Hole Peg Test  24.29 sec.       Edema   Edema  none, but did note mild atrophy Lt thumb      ROM / Strength   AROM / PROM / Strength  AROM      AROM   Overall AROM Comments  BUE AROM WNL's. (see below for thumb ROM)       Left Hand AROM   L Thumb MCP 0-60  40 Degrees Rt = 60    L Thumb IP 0-80  70 Degrees    L Thumb Radial ADduction/ABduction 0-55  80 (Rt = 88)     L Thumb Palmar ADduction/ABduction 0-45  80  (Rt = 83)       Hand Function   Right Hand Grip (lbs)  65 LB    Right Hand Lateral Pinch  20 lbs    Right Hand 3 Point Pinch  20 lbs    Left Hand Grip (lbs)  50 LBS    Left Hand Lateral Pinch  8 lbs    Left 3 point pinch  3 lbs               OT Treatments/Exercises (OP) - 03/17/17 0001      Splinting   Splinting  Began fabrication and fitting of hand based thermoplastic splint for Lt thumb (per MD orders). To finish next session d/t time constraints. Pt to continue wearing pre-fab thumb spica splint until then.               OT Short Term Goals - 03/17/17 1427      OT SHORT TERM GOAL #1   Title  Independent with splint wear and care    Time  4    Period  Weeks    Status  On-going    Target  Date  04/15/17      OT SHORT TERM GOAL #2   Title  Independent with HEP     Time  4    Period  Weeks    Status  New      OT SHORT TERM GOAL #3   Title  Pt to increase Lt thumb MP flexion by 10 degrees    Baseline  40*     Time  4    Period  Weeks    Status  New      OT SHORT TERM GOAL #4   Title  Pain to be less than or equal to 4/10 consistently    Baseline  up to 7/10    Time  4    Period  Weeks    Status  New        OT Long Term Goals - 03/17/17 1429       OT LONG TERM GOAL #1   Title  Pt to increase grip strength Lt hand by 5 lbs. or greater     Baseline  50 lbs    Time  8    Period  Weeks    Status  New    Target Date  05/15/17      OT LONG TERM GOAL #2   Title  Pt to improve lateral pinch and pinch strength each by 5 lbs Lt hand    Baseline  lateral = 8 lbs., 3 tip = 3 lbs    Time  8    Period  Weeks    Status  New      OT LONG TERM GOAL #3   Title  Pt to return to using Lt hand as assist for all bilateral tasks    Time  8    Period  Weeks    Status  New            Plan - 03/17/17 1422    Clinical Impression Statement  Pt is a 31 y.o. female who presents to outpatient rehab s/p Lt thumb UCL tear and avulsion fracture on 01/31/17 from falling while roller skating. Pt has been in pre-fab thumb spica splint for approx. 6 weeks (taking off to shower and perform some light BADLS). Pt still working full time. Pt presents with decr. ROM, decr. grip and pinch strength, and pain Lt thumb with certain movements.     Occupational Profile and client history currently impacting functional performance  No significant PMH. Current deficits making certain ADL and work related tasks difficult/painful    Occupational performance deficits (Please refer to evaluation for details):  ADL's;IADL's;Work;Leisure;Social Participation    Rehab Potential  Good    OT Frequency  -- 2x/wk for first week, followed by 1x/wk x 7 weeks per pt request d/t work schedule    OT Duration  8 weeks    OT Treatment/Interventions  Moist Heat;Fluidtherapy;DME and/or AE instruction;Splinting;Patient/family education;Self-care/ADL training;Therapeutic exercises;Ultrasound;Therapeutic activities;Cryotherapy;Iontophoresis;Passive range of motion;Electrical Stimulation;Parrafin    Plan  finish and issue splint, splint wear and care education, issue HEP for ROM in MP flex, palmer abd, and putty HEP for grip and pinch strength    Clinical Decision Making  Limited treatment  options, no task modification necessary    Consulted and Agree with Plan of Care  Patient       Patient will benefit from skilled therapeutic intervention in order to improve the following deficits and impairments:  Impaired flexibility, Pain, Decreased mobility, Decreased strength, Decreased range of motion, Impaired  UE functional use, Decreased knowledge of precautions, Decreased activity tolerance  Visit Diagnosis: Stiffness of left hand, not elsewhere classified - Plan: Ot plan of care cert/re-cert  Muscle weakness (generalized) - Plan: Ot plan of care cert/re-cert  Pain in joint of left hand - Plan: Ot plan of care cert/re-cert    Problem List Patient Active Problem List   Diagnosis Date Noted  . Skier's thumb, left, initial encounter 03/07/2017  . Benign essential HTN 04/16/2016  . Thyromegaly 05/29/2011    Carey Bullocks, OTR/L 03/17/2017, 2:33 PM  [facility] [address] [place], [place], [address] Phone: [phone]   Fax:  [phone]  Name: LOETTA CONNELLEY MRN: [id number] Date of Birth: [birth year]

## 2017-03-20 ENCOUNTER — Ambulatory Visit: Payer: 59 | Admitting: Occupational Therapy

## 2017-03-20 DIAGNOSIS — M25542 Pain in joints of left hand: Secondary | ICD-10-CM | POA: Diagnosis not present

## 2017-03-20 DIAGNOSIS — M6281 Muscle weakness (generalized): Secondary | ICD-10-CM | POA: Diagnosis not present

## 2017-03-20 DIAGNOSIS — M25642 Stiffness of left hand, not elsewhere classified: Secondary | ICD-10-CM | POA: Diagnosis not present

## 2017-03-20 NOTE — Patient Instructions (Signed)
Your Splint This splint should initially be fitted by a healthcare practitioner.  The healthcare practitioner is responsible for providing wearing instructions and precautions to the patient, other healthcare practitioners and care provider involved in the patient's care.  This splint was custom made for you. Please read the following instructions to learn about wearing and caring for your splint.  Precautions Should your splint cause any of the following problems, remove the splint immediately and contact your therapist/physician.  Swelling  Severe Pain  Pressure Areas  Stiffness  Numbness  Do not wear your splint while operating machinery unless it has been fabricated for that purpose.  When To Wear Your Splint Where your splint according to your therapist/physician instructions. Daytime for work hours  Care and Cleaning of Your Splint 1. Keep your splint away from open flames. 2. Your splint will lose its shape in temperatures over 135 degrees Farenheit, ( in car windows, near radiators, ovens or in hot water).  Never make any adjustments to your splint, if the splint needs adjusting remove it and make an appointment to see your therapist. 3. Your splint, including the cushion liner may be cleaned with soap and lukewarm water.  Do not immerse in hot water over 135 degrees Farenheit. 4. Straps may be washed with soap and water, but do not moisten the self-adhesive portion. 5. For ink or hard to remove spots use a scouring cleanser which contains chlorine.  Rinse the splint thoroughly after using chlorine cleanser.  MP Flexion (Active)    Bend thumb to touch base of little finger, keeping tip joint straight. Repeat _10___ times. Do _4-6___ sessions per day.   Palmar Adduction/Abduction (Active)    Move thumb down, away from palm. Move back to rest along palm. Repeat __10__ times. Do _4-6___ sessions per day.   1. Grip Strengthening (Resistive Putty)   Squeeze putty  using thumb and all fingers. Do with RED putty Repeat _20___ times. Do __2__ sessions per day.  2. Lateral Pinch Strengthening (Resistive Putty)    Squeeze/pinch YELLOW putty between thumb and side of index finger Repeat _10___ times. Do _2___ sessions per day.     3. Roll YELLOW putty into tube on table and pinch between index finger, long finger and thumb x 10 reps each. Do 2 sessions per day

## 2017-03-20 NOTE — Therapy (Signed)
[facility] [address] [place], [place], [address] Phone: [phone]   Fax:  [phone]  Occupational Therapy Treatment  Patient Details  Name: Nicole Mccall MRN: [id number] Date of Birth: 04-04-1986 Referring Provider: Dr. Frankey Shown   Encounter Date: 03/20/2017  OT End of Session - 03/20/17 1402    Visit Number  2    Number of Visits  12    Date for OT Re-Evaluation  05/15/17    Authorization Type  UMR    OT Start Time  1315    OT Stop Time  1408    OT Time Calculation (min)  53 min    Activity Tolerance  Patient tolerated treatment well       Past Medical History:  Diagnosis Date  . Anemia   . Anxiety    no meds  . GERD (gastroesophageal reflux disease)    diet controlled - no med  . Hypertension     Past Surgical History:  Procedure Laterality Date  . HYSTEROSCOPY N/A 12/06/2016   Procedure: HYSTEROSCOPY;  Surgeon: Janyth Pupa, DO;  Location: [facility];  Service: Gynecology;  Laterality: N/A;  . WISDOM TOOTH EXTRACTION      There were no vitals filed for this visit.  Subjective Assessment - 03/20/17 1329    Pertinent History  Pt's injury to Lt thumb on 01/31/17 with avulsion fx and UCL tear, has had thumb spica splint on for approx. 6 weeks (provided by MD office) from eval date 03/17/17    Currently in Pain?  No/denies                   OT Treatments/Exercises (OP) - 03/20/17 0001      Exercises   Exercises  Hand      Hand Exercises   Other Hand Exercises  Pt issued thumb A/ROM HEP (MP flexion and palmer abd) and putty HEP. Pt issued red resistance putty for grip strength, and yellow resistance putty for lateral and 3 tip pinch. See pt instructions for details.       Modalities   Modalities  Fluidotherapy      LUE Fluidotherapy   Number Minutes Fluidotherapy  12 Minutes    LUE Fluidotherapy Location  Hand    Comments  to decr. stiffness/mild pain at end of session       Splinting   Splinting  Finished hand based thermoplastic splint for Lt thumb and issued. Pt instructed in wear and care and recommended wearing at work and for lifting tasks.              OT Education - 03/20/17 1350    Education provided  Yes    Education Details  splint wear and care, A/ROM HEP, putty HEP    Person(s) Educated  Patient    Methods  Explanation;Demonstration;Handout    Comprehension  Verbalized understanding;Returned demonstration       OT Short Term Goals - 03/20/17 1403      OT SHORT TERM GOAL #1   Title  Independent with splint wear and care    Time  4    Period  Weeks    Status  On-going      OT SHORT TERM GOAL #2   Title  Independent with HEP     Time  4    Period  Weeks    Status  On-going      OT SHORT TERM GOAL #3   Title  Pt to  increase Lt thumb MP flexion by 10 degrees    Baseline  40*     Time  4    Period  Weeks    Status  On-going      OT SHORT TERM GOAL #4   Title  Pain to be less than or equal to 4/10 consistently    Baseline  up to 7/10    Time  4    Period  Weeks    Status  On-going        OT Long Term Goals - 03/17/17 1429      OT LONG TERM GOAL #1   Title  Pt to increase grip strength Lt hand by 5 lbs. or greater     Baseline  50 lbs    Time  8    Period  Weeks    Status  New    Target Date  05/15/17      OT LONG TERM GOAL #2   Title  Pt to improve lateral pinch and pinch strength each by 5 lbs Lt hand    Baseline  lateral = 8 lbs., 3 tip = 3 lbs    Time  8    Period  Weeks    Status  New      OT LONG TERM GOAL #3   Title  Pt to return to using Lt hand as assist for all bilateral tasks    Time  8    Period  Weeks    Status  New            Plan - 03/20/17 1404    Clinical Impression Statement  Pt tolerating A/ROM and putty ex's with only minimal pain (described as "tight" pain).     Rehab Potential  Good    OT Frequency  -- 2x/wk for first week, then 1x/wk for next 7 weeks    OT Duration  8  weeks    OT Treatment/Interventions  Moist Heat;Fluidtherapy;DME and/or AE instruction;Splinting;Patient/family education;Self-care/ADL training;Therapeutic exercises;Ultrasound;Therapeutic activities;Cryotherapy;Iontophoresis;Passive range of motion;Electrical Stimulation;Parrafin    Plan  continue fluido, A/ROM and strengthening for Lt thumb    Consulted and Agree with Plan of Care  Patient       Patient will benefit from skilled therapeutic intervention in order to improve the following deficits and impairments:  Impaired flexibility, Pain, Decreased mobility, Decreased strength, Decreased range of motion, Impaired UE functional use, Decreased knowledge of precautions, Decreased activity tolerance  Visit Diagnosis: Stiffness of left hand, not elsewhere classified  Pain in joint of left hand  Muscle weakness (generalized)    Problem List Patient Active Problem List   Diagnosis Date Noted  . Skier's thumb, left, initial encounter 03/07/2017  . Benign essential HTN 04/16/2016  . Thyromegaly 05/29/2011    Carey Bullocks, OTR/L 03/20/2017, 2:06 PM  [facility] [address] [place], [place], [address] Phone: [phone]   Fax:  [phone]  Name: Nicole Mccall MRN: [id number] Date of Birth: 02-Nov-1985

## 2017-03-31 ENCOUNTER — Ambulatory Visit: Payer: 59 | Admitting: Occupational Therapy

## 2017-04-09 ENCOUNTER — Ambulatory Visit: Payer: 59 | Attending: Orthopaedic Surgery | Admitting: Occupational Therapy

## 2017-04-09 DIAGNOSIS — M25542 Pain in joints of left hand: Secondary | ICD-10-CM | POA: Diagnosis not present

## 2017-04-09 DIAGNOSIS — M25642 Stiffness of left hand, not elsewhere classified: Secondary | ICD-10-CM | POA: Insufficient documentation

## 2017-04-09 DIAGNOSIS — N930 Postcoital and contact bleeding: Secondary | ICD-10-CM | POA: Diagnosis not present

## 2017-04-09 DIAGNOSIS — M6281 Muscle weakness (generalized): Secondary | ICD-10-CM | POA: Diagnosis not present

## 2017-04-09 DIAGNOSIS — Z3041 Encounter for surveillance of contraceptive pills: Secondary | ICD-10-CM | POA: Diagnosis not present

## 2017-04-09 DIAGNOSIS — N898 Other specified noninflammatory disorders of vagina: Secondary | ICD-10-CM | POA: Diagnosis not present

## 2017-04-09 DIAGNOSIS — Z01411 Encounter for gynecological examination (general) (routine) with abnormal findings: Secondary | ICD-10-CM | POA: Diagnosis not present

## 2017-04-09 NOTE — Therapy (Signed)
[facility] [address] [place], [place], [address] Phone: [phone]   Fax:  [phone]  Occupational Therapy Treatment  Patient Details  Name: Nicole Mccall MRN: [id number] Date of Birth: [birth year] Referring Provider: Dr. Frankey Shown   Encounter Date: 04/09/2017  OT End of Session - 04/09/17 1146    Visit Number  3    Number of Visits  12    Date for OT Re-Evaluation  05/15/17    Authorization Type  UMR    OT Start Time  1100    OT Stop Time  1145    OT Time Calculation (min)  45 min    Activity Tolerance  Patient tolerated treatment well    Behavior During Therapy  [facility] for tasks assessed/performed       Past Medical History:  Diagnosis Date  . Anemia   . Anxiety    no meds  . GERD (gastroesophageal reflux disease)    diet controlled - no med  . Hypertension     Past Surgical History:  Procedure Laterality Date  . HYSTEROSCOPY N/A 12/06/2016   Procedure: HYSTEROSCOPY;  Surgeon: Janyth Pupa, DO;  Location: [facility];  Service: Gynecology;  Laterality: N/A;  . WISDOM TOOTH EXTRACTION      There were no vitals filed for this visit.  Subjective Assessment - 04/09/17 1117    Subjective   The highest my pain gets is usually 4/10 (re: Lt thumb)     Pertinent History  Pt's injury to Lt thumb on 01/31/17 with avulsion fx and UCL tear, has had thumb spica splint on for approx. 6 weeks (provided by MD office) from eval date 03/17/17    Currently in Pain?  No/denies none today         [facility] OT Assessment - 04/09/17 0001      Hand Function   Left Hand Grip (lbs)  65 lbs               OT Treatments/Exercises (OP) - 04/09/17 0001      ADLs   ADL Comments  Pain at end of session 1/10 Lt thumb      Hand Exercises   Other Hand Exercises  Reviewed previously issued A/ROM and putty ex's. Added radial abduction and circumduction ex's to thumb. Pt return demo x 10 reps each. Pt still having mild  pain/difficulty with 3 tip and lateral pinch strength, therefore recommended continuing to use yellow resistance putty for pinching ex's until easy. Pt issued green resistance putty for grip strength d/t increased grip strength since eval (see assessment)     Other Hand Exercises  Clothespin activity for pinch strength yellow to green resistance. Gripper activity at level 1 resistance to pick up blocks Lt hand for sustained grip strength with max drops (pt has sufficient grip strength for this, but pt not sustaining grip t/o task). Pt required 2 rest breaks      LUE Fluidotherapy   Number Minutes Fluidotherapy  12 Minutes    LUE Fluidotherapy Location  Hand    Comments  at beginning of session to decr. stiffness      Splinting   Splinting  Minor adjustment made to splint for comfort and new hook and strap provided.                OT Short Term Goals - 04/09/17 1146      OT SHORT TERM GOAL #1   Title  Independent with splint wear and  care    Time  4    Period  Weeks    Status  Achieved      OT SHORT TERM GOAL #2   Title  Independent with HEP     Time  4    Period  Weeks    Status  Achieved      OT SHORT TERM GOAL #3   Title  Pt to increase Lt thumb MP flexion by 10 degrees    Baseline  40*     Time  4    Period  Weeks    Status  On-going      OT SHORT TERM GOAL #4   Title  Pain to be less than or equal to 4/10 consistently    Baseline  up to 7/10    Time  4    Period  Weeks    Status  Achieved        OT Long Term Goals - 03/17/17 1429      OT LONG TERM GOAL #1   Title  Pt to increase grip strength Lt hand by 5 lbs. or greater     Baseline  50 lbs    Time  8    Period  Weeks    Status  New    Target Date  05/15/17      OT LONG TERM GOAL #2   Title  Pt to improve lateral pinch and pinch strength each by 5 lbs Lt hand    Baseline  lateral = 8 lbs., 3 tip = 3 lbs    Time  8    Period  Weeks    Status  New      OT LONG TERM GOAL #3   Title  Pt to return  to using Lt hand as assist for all bilateral tasks    Time  8    Period  Weeks    Status  New            Plan - 04/09/17 1147    Clinical Impression Statement  Pt met 3/4 STG's. Pt improving with ROM, grip strength, and overall decreased pain    Rehab Potential  Good    OT Treatment/Interventions  Self-care/ADL training;Moist Heat;Fluidtherapy;DME and/or AE instruction;Splinting;Therapeutic activities;Ultrasound;Cryotherapy;Iontophoresis;Passive range of motion;Paraffin;Electrical Stimulation;Manual Therapy;Patient/family education    Plan  continue fluido, A/ROM and strengthening for Lt thumb, assess remaining STG and begin assessing LTG's.     Consulted and Agree with Plan of Care  Patient       Patient will benefit from skilled therapeutic intervention in order to improve the following deficits and impairments:  Impaired flexibility, Pain, Decreased mobility, Decreased strength, Decreased range of motion, Impaired UE functional use, Decreased knowledge of precautions, Decreased activity tolerance  Visit Diagnosis: Stiffness of left hand, not elsewhere classified  Muscle weakness (generalized)    Problem List Patient Active Problem List   Diagnosis Date Noted  . Skier's thumb, left, initial encounter 03/07/2017  . Benign essential HTN 04/16/2016  . Thyromegaly 05/29/2011    Carey Bullocks, OTR/L 04/09/2017, 11:49 AM  [facility] [address] [place], [place], [address] Phone: [phone]   Fax:  [phone]  Name: Nicole Mccall MRN: [id number] Date of Birth: May 22, 1985

## 2017-04-15 ENCOUNTER — Ambulatory Visit: Payer: 59 | Admitting: Occupational Therapy

## 2017-04-15 DIAGNOSIS — M25642 Stiffness of left hand, not elsewhere classified: Secondary | ICD-10-CM | POA: Diagnosis not present

## 2017-04-15 DIAGNOSIS — M6281 Muscle weakness (generalized): Secondary | ICD-10-CM

## 2017-04-15 DIAGNOSIS — M25542 Pain in joints of left hand: Secondary | ICD-10-CM | POA: Diagnosis not present

## 2017-04-15 NOTE — Therapy (Signed)
Nicole Mccall [address] [place], [place], [address] Phone: [phone]   Fax:  [phone]  Occupational Therapy Treatment  Patient Details  Name: Nicole Mccall MRN: [id number] Date of Birth: 05/12/1985 Referring Provider (Historical): Dr. Frankey Shown   Encounter Date: 04/15/2017  OT End of Session - 04/15/17 1503    Visit Number  4    Number of Visits  12    Date for OT Re-Evaluation  05/15/17    Authorization Type  UMR    OT Start Time  1448    OT Stop Time  1530    OT Time Calculation (min)  42 min       Past Medical History:  Diagnosis Date  . Anemia   . Anxiety    no meds  . GERD (gastroesophageal reflux disease)    diet controlled - no med  . Hypertension     Past Surgical History:  Procedure Laterality Date  . HYSTEROSCOPY N/A 12/06/2016   Procedure: HYSTEROSCOPY;  Surgeon: Janyth Pupa, DO;  Location: [facility];  Service: Gynecology;  Laterality: N/A;  . WISDOM TOOTH EXTRACTION      There were no vitals filed for this visit.  Subjective Assessment - 04/15/17 1503    Pertinent History  Pt's injury to Lt thumb on 01/31/17 with avulsion fx and UCL tear, has had thumb spica splint on for approx. 6 weeks (provided by MD office) from eval date 03/17/17    Currently in Pain?  No/denies              Hand Exercises  Other Hand Exercises  A/ROM thumb flexion and circumduction ex's to thumb. Pt return demo x 10 reps each. Reviewed putty exercises. Pt still having mild pain/difficulty with 3 tip and lateral pinch strength, therefore recommended continuing to use yellow resistance putty for pinching ex's until easy. Pt uses red putty for lateral pinch and green for grip.    Other Hand Exercises  Clothespin activity for pinch strength yellow to black resistance. Gripper activity at level 1 resistance to pick up blocks Lt hand for sustained grip strength with min difficulty . Pt required 1 rest break.  Velcro roller for key pinch with rotations x 6 reps each with large and small grip.    LUE Fluidotherapy  Number Minutes Fluidotherapy  15 Minutes   LUE Fluidotherapy Location  Hand   Comments  at beginning of session to decr. stiffness , no adverse reactions                  OT Short Term Goals - 04/15/17 1504      OT SHORT TERM GOAL #1   Title  Independent with splint wear and care    Time  4    Period  Weeks    Status  Achieved      OT SHORT TERM GOAL #2   Title  Independent with HEP     Time  4    Period  Weeks    Status  Achieved      OT SHORT TERM GOAL #3   Title  Pt to increase Lt thumb MP flexion by 10 degrees    Baseline  40*     Time  4    Period  Weeks    Status  Achieved 50      OT SHORT TERM GOAL #4   Title  Pain to be less than or equal to 4/10 consistently  Baseline  up to 7/10    Time  4    Period  Weeks    Status  Achieved        OT Long Term Goals - 03/17/17 1429      OT LONG TERM GOAL #1   Title  Pt to increase grip strength Lt hand by 5 lbs. or greater     Baseline  50 lbs    Time  8    Period  Weeks    Status  New    Target Date  05/15/17      OT LONG TERM GOAL #2   Title  Pt to improve lateral pinch and pinch strength each by 5 lbs Lt hand    Baseline  lateral = 8 lbs., 3 tip = 3 lbs    Time  8    Period  Weeks    Status  New      OT LONG TERM GOAL #3   Title  Pt to return to using Lt hand as assist for all bilateral tasks    Time  8    Period  Weeks    Status  New            Plan - 04/15/17 1511    Clinical Impression Statement  Pt met remaining STG. Pt demonstrates improving ROM, grip strength and decreased pain    Occupational Profile and client history currently impacting functional performance  No significant PMH. Current deficits making certain ADL and work related tasks difficult/painful    Occupational performance deficits (Please refer to evaluation for details):  ADL's;IADL's;Work;Leisure;Social  Participation    Rehab Potential  Good    OT Frequency  -- 2x week for first week , then 1x week    OT Duration  8 weeks    OT Treatment/Interventions  Self-care/ADL training;Moist Heat;Fluidtherapy;DME and/or AE instruction;Splinting;Therapeutic activities;Ultrasound;Cryotherapy;Iontophoresis;Passive range of motion;Paraffin;Electrical Stimulation;Manual Therapy;Patient/family education    Plan  continue fluido, A/ROM and strengthening for Lt thumb, assess remaining STG and begin assessing LTG's.     Consulted and Agree with Plan of Care  Patient       Patient will benefit from skilled therapeutic intervention in order to improve the following deficits and impairments:  Impaired flexibility, Pain, Decreased mobility, Decreased strength, Decreased range of motion, Impaired UE functional use, Decreased knowledge of precautions, Decreased activity tolerance  Visit Diagnosis: Stiffness of left hand, not elsewhere classified  Muscle weakness (generalized)  Pain in joint of left hand    Problem List Patient Active Problem List   Diagnosis Date Noted  . Skier's thumb, left, initial encounter 03/07/2017  . Benign essential HTN 04/16/2016  . Thyromegaly 05/29/2011    Nicole Mccall 04/15/2017, 3:46 PM Theone Murdoch, OTR/L [phone] Phone: [phone] 3:51 PM 04/15/17 [name] [address] [place], [place], [address] Phone: [phone]   Fax:  [phone]  Name: Nicole Mccall MRN: [id number] Date of Birth: Oct 25, 1985

## 2017-04-21 ENCOUNTER — Encounter (INDEPENDENT_AMBULATORY_CARE_PROVIDER_SITE_OTHER): Payer: Self-pay | Admitting: Orthopaedic Surgery

## 2017-04-21 ENCOUNTER — Ambulatory Visit (INDEPENDENT_AMBULATORY_CARE_PROVIDER_SITE_OTHER): Payer: 59 | Admitting: Orthopaedic Surgery

## 2017-04-21 DIAGNOSIS — S63642A Sprain of metacarpophalangeal joint of left thumb, initial encounter: Secondary | ICD-10-CM

## 2017-04-21 MED FILL — HYDROCHLOROTHIAZIDE 12.5 MG: 12.5 | 90 days supply | Qty: 90 | Fill #2

## 2017-04-21 MED FILL — LARIN FE 1.5-30 TABLET: 1.5-30 | 84 days supply | Qty: 84 | Fill #0

## 2017-04-21 NOTE — Progress Notes (Signed)
   Office Visit Note   Patient: Nicole Mccall           Date of Birth: 1985/06/29           MRN: [id number] Visit Date: 04/21/2017              Requested by: Leeroy Cha, MD [address] [place], [place] [address] PCP: Leeroy Cha, MD   Assessment & Plan: Visit Diagnoses:  1. Skier's thumb, left, initial encounter     Plan: Patient is 11 weeks status post left skiers thumb.  She has done well with treatment.  At this point she may begin to wean as tolerated.  Increase activity and use of the hand as needed and tolerated.  Follow-up as needed.  Follow-Up Instructions: Return if symptoms worsen or fail to improve.   Orders:  No orders of the defined types were placed in this encounter.  No orders of the defined types were placed in this encounter.     Procedures: No procedures performed   Clinical Data: No additional findings.   Subjective: Chief Complaint  Patient presents with  . Left Thumb - Follow-up    Patient is following up for her left thumb.  She is 11 weeks status post the initial injury.  She had a session of hand therapy.  Her grip strength has improved significantly.  She wears a brace at work when needed.    Review of Systems   Objective: Vital Signs: There were no vitals taken for this visit.  Physical Exam  Ortho Exam Physical exam of the left thumb shows a stable ulnar collateral ligament with testing.  She has good grip strength. Specialty Comments:  No specialty comments available.  Imaging: No results found.   PMFS History: Patient Active Problem List   Diagnosis Date Noted  . Skier's thumb, left, initial encounter 03/07/2017  . Benign essential HTN 04/16/2016  . Thyromegaly 05/29/2011   Past Medical History:  Diagnosis Date  . Anemia   . Anxiety    no meds  . GERD (gastroesophageal reflux disease)    diet controlled - no med  . Hypertension     Family History  Problem Relation Age of  Onset  . Cancer Mother   . Cancer Father   . Hyperlipidemia Father   . Diabetes Maternal Grandmother   . Stroke Maternal Grandmother   . Hypertension Maternal Grandfather   . Mental illness Maternal Grandfather     Past Surgical History:  Procedure Laterality Date  . HYSTEROSCOPY N/A 12/06/2016   Procedure: HYSTEROSCOPY;  Surgeon: Janyth Pupa, DO;  Location: [facility];  Service: Gynecology;  Laterality: N/A;  . WISDOM TOOTH EXTRACTION     Social History   Occupational History  . Not on file  Tobacco Use  . Smoking status: Never Smoker  . Smokeless tobacco: Never Used  Substance and Sexual Activity  . Alcohol use: No    Alcohol/week: 0.0 oz  . Drug use: No  . Sexual activity: Yes    Birth control/protection: Pill

## 2017-04-23 ENCOUNTER — Ambulatory Visit: Payer: 59 | Admitting: Occupational Therapy

## 2017-04-23 DIAGNOSIS — M6281 Muscle weakness (generalized): Secondary | ICD-10-CM

## 2017-04-23 DIAGNOSIS — M25642 Stiffness of left hand, not elsewhere classified: Secondary | ICD-10-CM

## 2017-04-23 DIAGNOSIS — M25542 Pain in joints of left hand: Secondary | ICD-10-CM

## 2017-04-23 NOTE — Therapy (Signed)
[facility] [address] [place], [place], [address] Phone: [phone]   Fax:  [phone]  Occupational Therapy Treatment  Patient Details  Name: Nicole Mccall MRN: [id number] Date of Birth: 08/31/1985 Referring Provider (Historical): Dr. Frankey Shown   Encounter Date: 04/23/2017  OT End of Session - 04/23/17 1316    Visit Number  5    Number of Visits  12    Date for OT Re-Evaluation  05/15/17    Authorization Type  UMR    OT Start Time  1230    OT Stop Time  1315    OT Time Calculation (min)  45 min    Activity Tolerance  Patient tolerated treatment well    Behavior During Therapy  [facility] for tasks assessed/performed       Past Medical History:  Diagnosis Date  . Anemia   . Anxiety    no meds  . GERD (gastroesophageal reflux disease)    diet controlled - no med  . Hypertension     Past Surgical History:  Procedure Laterality Date  . HYSTEROSCOPY N/A 12/06/2016   Procedure: HYSTEROSCOPY;  Surgeon: Janyth Pupa, DO;  Location: [facility];  Service: Gynecology;  Laterality: N/A;  . WISDOM TOOTH EXTRACTION      There were no vitals filed for this visit.  Subjective Assessment - 04/23/17 1240    Subjective   The highest my pain gets is usually 2/10 (re: Lt thumb)     Pertinent History  Pt's injury to Lt thumb on 01/31/17 with avulsion fx and UCL tear, has had thumb spica splint on for approx. 6 weeks (provided by MD office) from eval date 03/17/17    Currently in Pain?  No/denies         [facility] OT Assessment - 04/23/17 0001      Hand Function   Left Hand Grip (lbs)  65 lbs average (75, 65, 55 lbs)                OT Treatments/Exercises (OP) - 04/23/17 0001      Hand Exercises   Other Hand Exercises  Clothespin activity for pinch strength: red to blue resistance Lt hand to place on antenna and remove. Velcro roller to simulate key turning for lateral pinch    Other Hand Exercises  Gripper set  at level 2 resistance to pick up blocks for sustained grip strength with mod difficulty and min drops, 2 rest breaks required. Simulated wringing out washcloth, and played "tug-of-war" with towel for girp strength      LUE Fluidotherapy   Number Minutes Fluidotherapy  12 Minutes    LUE Fluidotherapy Location  Hand    Comments  to decr. stiffness Lt thumb               OT Short Term Goals - 04/15/17 1504      OT SHORT TERM GOAL #1   Title  Independent with splint wear and care    Time  4    Period  Weeks    Status  Achieved      OT SHORT TERM GOAL #2   Title  Independent with HEP     Time  4    Period  Weeks    Status  Achieved      OT SHORT TERM GOAL #3   Title  Pt to increase Lt thumb MP flexion by 10 degrees    Baseline  40*  Time  4    Period  Weeks    Status  Achieved 50      OT SHORT TERM GOAL #4   Title  Pain to be less than or equal to 4/10 consistently    Baseline  up to 7/10    Time  4    Period  Weeks    Status  Achieved        OT Long Term Goals - 04/23/17 1317      OT LONG TERM GOAL #1   Title  Pt to increase grip strength Lt hand by 5 lbs. or greater     Baseline  50 lbs    Time  8    Period  Weeks    Status  Achieved 65 lbs      OT LONG TERM GOAL #2   Title  Pt to improve lateral pinch and pinch strength each by 5 lbs Lt hand    Baseline  lateral = 8 lbs., 3 tip = 3 lbs    Time  8    Period  Weeks    Status  On-going      OT LONG TERM GOAL #3   Title  Pt to return to using Lt hand as assist for all bilateral tasks    Time  8    Period  Weeks    Status  Achieved requires brace for heavier lifting            Plan - 04/23/17 1317    Clinical Impression Statement  Pt has met 2/3 LTG's at this time. Pt continues to improve with function, strength, and decreased pain Lt thumb    Occupational Profile and client history currently impacting functional performance  No significant PMH. Current deficits making certain ADL and work  related tasks difficult/painful    Rehab Potential  Good    OT Frequency  1x / week    OT Duration  8 weeks    OT Treatment/Interventions  Self-care/ADL training;Moist Heat;Fluidtherapy;DME and/or AE instruction;Splinting;Therapeutic activities;Ultrasound;Cryotherapy;Iontophoresis;Passive range of motion;Paraffin;Electrical Stimulation;Manual Therapy;Patient/family education    Plan  continue fluidotherapy, grip and pinch strengthening, assess remaining LTG and d/c next session       Patient will benefit from skilled therapeutic intervention in order to improve the following deficits and impairments:  Impaired flexibility, Pain, Decreased mobility, Decreased strength, Decreased range of motion, Impaired UE functional use, Decreased knowledge of precautions, Decreased activity tolerance  Visit Diagnosis: Stiffness of left hand, not elsewhere classified  Muscle weakness (generalized)  Pain in joint of left hand    Problem List Patient Active Problem List   Diagnosis Date Noted  . Skier's thumb, left, initial encounter 03/07/2017  . Benign essential HTN 04/16/2016  . Thyromegaly 05/29/2011    Carey Bullocks, OTR/L 04/23/2017, 1:20 PM  [facility] [address] [place], [place], [address] Phone: [phone]   Fax:  [phone]  Name: Nicole Mccall MRN: [id number] Date of Birth: 1985-06-15

## 2017-05-07 ENCOUNTER — Ambulatory Visit: Payer: 59 | Attending: Orthopaedic Surgery | Admitting: Occupational Therapy

## 2017-05-07 DIAGNOSIS — M25642 Stiffness of left hand, not elsewhere classified: Secondary | ICD-10-CM | POA: Diagnosis not present

## 2017-05-07 DIAGNOSIS — M6281 Muscle weakness (generalized): Secondary | ICD-10-CM | POA: Insufficient documentation

## 2017-05-07 NOTE — Therapy (Signed)
[facility] [address] [place], [place], [address] Phone: [phone]   Fax:  [phone]  Occupational Therapy Treatment  Patient Details  Name: Nicole Mccall MRN: [id number] Date of Birth: 14-Sep-1985 Referring Provider (Historical): Dr. Frankey Shown   Encounter Date: 05/07/2017  OT End of Session - 05/07/17 0839    Visit Number  6    Number of Visits  12    Date for OT Re-Evaluation  05/15/17    Authorization Type  UMR    OT Start Time  0800    OT Stop Time  0839    OT Time Calculation (min)  39 min    Activity Tolerance  Patient tolerated treatment well       Past Medical History:  Diagnosis Date  . Anemia   . Anxiety    no meds  . GERD (gastroesophageal reflux disease)    diet controlled - no med  . Hypertension     Past Surgical History:  Procedure Laterality Date  . HYSTEROSCOPY N/A 12/06/2016   Procedure: HYSTEROSCOPY;  Surgeon: Janyth Pupa, DO;  Location: [facility];  Service: Gynecology;  Laterality: N/A;  . WISDOM TOOTH EXTRACTION      There were no vitals filed for this visit.  Subjective Assessment - 05/07/17 0808    Subjective   I felt normal the past week    Pertinent History  Pt's injury to Lt thumb on 01/31/17 with avulsion fx and UCL tear, has had thumb spica splint on for approx. 6 weeks (provided by MD office) from eval date 03/17/17    Currently in Pain?  No/denies         [facility] OT Assessment - 05/07/17 0001      Hand Function   Left Hand Lateral Pinch  15 lbs    Left 3 point pinch  12 lbs               OT Treatments/Exercises (OP) - 05/07/17 0001      Hand Exercises   Other Hand Exercises  Clothespin activity for pinch strength: red to black resistance Lt hand to place on antenna and remove; pain up to 1/10 only.     Other Hand Exercises  Gripper set at level 2 resistance to pick up blocks for sustained grip strength with mod difficulty and min drops, 2 rest breaks  required. Simulated wringing out washcloth, and played "tug-of-war" with towel for girp strength      LUE Fluidotherapy   Number Minutes Fluidotherapy  10 Minutes    LUE Fluidotherapy Location  Hand    Comments  to decr. stiffness at beginning of session               OT Short Term Goals - 04/15/17 1504      OT SHORT TERM GOAL #1   Title  Independent with splint wear and care    Time  4    Period  Weeks    Status  Achieved      OT SHORT TERM GOAL #2   Title  Independent with HEP     Time  4    Period  Weeks    Status  Achieved      OT SHORT TERM GOAL #3   Title  Pt to increase Lt thumb MP flexion by 10 degrees    Baseline  40*     Time  4    Period  Weeks    Status  Achieved 50      OT SHORT TERM GOAL #4   Title  Pain to be less than or equal to 4/10 consistently    Baseline  up to 7/10    Time  4    Period  Weeks    Status  Achieved        OT Long Term Goals - 05/07/17 5520      OT LONG TERM GOAL #1   Title  Pt to increase grip strength Lt hand by 5 lbs. or greater     Baseline  50 lbs    Time  8    Period  Weeks    Status  Achieved 65 lbs      OT LONG TERM GOAL #2   Title  Pt to improve lateral pinch and pinch strength each by 5 lbs Lt hand    Baseline  lateral = 8 lbs., 3 tip = 3 lbs    Time  8    Period  Weeks    Status  Achieved Lat = 15 lbs, 3 tip pinch = 12 lbs      OT LONG TERM GOAL #3   Title  Pt to return to using Lt hand as assist for all bilateral tasks    Time  8    Period  Weeks    Status  Achieved requires brace for heavier lifting            Plan - 05/07/17 0839    Clinical Impression Statement  Pt has met all LTG's. Pt has improved with lateral and 3 tip pinch strength significantly    OT Treatment/Interventions  Self-care/ADL training;Moist Heat;Fluidtherapy;DME and/or AE instruction;Splinting;Therapeutic activities;Ultrasound;Cryotherapy;Iontophoresis;Passive range of motion;Paraffin;Electrical Stimulation;Manual  Therapy;Patient/family education    Plan  D/C O.T.     Consulted and Agree with Plan of Care  Patient       Patient will benefit from skilled therapeutic intervention in order to improve the following deficits and impairments:  Impaired flexibility, Pain, Decreased mobility, Decreased strength, Decreased range of motion, Impaired UE functional use, Decreased knowledge of precautions, Decreased activity tolerance  Visit Diagnosis: Stiffness of left hand, not elsewhere classified  Muscle weakness (generalized)    Problem List Patient Active Problem List   Diagnosis Date Noted  . Skier's thumb, left, initial encounter 03/07/2017  . Benign essential HTN 04/16/2016  . Thyromegaly 05/29/2011     OCCUPATIONAL THERAPY DISCHARGE SUMMARY  Visits from Start of Care: 6  Current functional level related to goals / functional outcomes: SEE ABOVE - PT MET ALL GOALS   Remaining deficits: None   Education / Equipment: HEP's   Plan: Patient agrees to discharge.  Patient goals were met. Patient is being discharged due to meeting the stated rehab goals.  ?????        Carey Bullocks, OTR/L 05/07/2017, 8:41 AM  [facility] [address] [place], [place], [address] Phone: [phone]   Fax:  [phone]  Name: Nicole Mccall MRN: [id number] Date of Birth: 1985/09/08

## 2017-05-21 DIAGNOSIS — I10 Essential (primary) hypertension: Secondary | ICD-10-CM | POA: Diagnosis not present

## 2017-05-21 DIAGNOSIS — E049 Nontoxic goiter, unspecified: Secondary | ICD-10-CM | POA: Diagnosis not present

## 2017-05-21 DIAGNOSIS — J302 Other seasonal allergic rhinitis: Secondary | ICD-10-CM | POA: Diagnosis not present

## 2017-05-21 DIAGNOSIS — Z0000 Encounter for general adult medical examination without abnormal findings: Secondary | ICD-10-CM | POA: Diagnosis not present

## 2017-06-06 MED FILL — metroNIDAZOLE 500 MG TABS: 500 | 7 days supply | Qty: 14 | Fill #3

## 2017-08-13 ENCOUNTER — Encounter: Payer: Self-pay | Admitting: Physician Assistant

## 2017-09-18 ENCOUNTER — Ambulatory Visit (INDEPENDENT_AMBULATORY_CARE_PROVIDER_SITE_OTHER): Payer: Self-pay | Admitting: Family Medicine

## 2017-09-18 VITALS — BP 124/90 | HR 97 | Temp 98.5°F | Resp 18 | Wt 157.0 lb

## 2017-09-18 DIAGNOSIS — N760 Acute vaginitis: Principal | ICD-10-CM

## 2017-09-18 DIAGNOSIS — B9689 Other specified bacterial agents as the cause of diseases classified elsewhere: Secondary | ICD-10-CM

## 2017-09-18 MED ORDER — METRONIDAZOLE 500 MG PO TABS: 500.0000 mg | ORAL_TABLET | Freq: Two times a day (BID) | ORAL | 0 refills | Status: AC

## 2017-09-18 MED FILL — metroNIDAZOLE 500 MG TABS: 500 | 7 days supply | Qty: 14 | Fill #0

## 2017-09-18 NOTE — Progress Notes (Signed)
Nicole Mccall is a 32 y.o. female who presents today with concerns of vaginal discharge with symptoms persisting x 3 days. Patient reports a chronic history of this condition, she has been under the care of GYN and states her last infection was over 6-7 months ago. Patient reports she feels this exacerbation is related to her diet. Her menstrual cycle recently ended.  Review of Systems  Constitutional: Negative for chills, fever and malaise/fatigue.  HENT: Negative for congestion, ear discharge, ear pain, sinus pain and sore throat.   Eyes: Negative.   Respiratory: Negative for cough, sputum production and shortness of breath.   Cardiovascular: Negative.  Negative for chest pain.  Gastrointestinal: Negative for abdominal pain, diarrhea, nausea and vomiting.  Genitourinary: Negative for dysuria, frequency, hematuria and urgency.       Vaginal discharge x 3 days  Musculoskeletal: Negative for myalgias.  Skin: Negative.   Neurological: Negative for headaches.  Endo/Heme/Allergies: Negative.   Psychiatric/Behavioral: Negative.     O: Vitals:   09/18/17 0930  BP: 124/90  Pulse: 97  Resp: 18  Temp: 98.5 F (36.9 C)  SpO2: 95%     Physical Exam  Constitutional: She is oriented to person, place, and time. Vital signs are normal. She appears well-developed and well-nourished. She is active.  Non-toxic appearance. She does not have a sickly appearance.  HENT:  Head: Normocephalic.  Nose: Nose normal.  Mouth/Throat: Uvula is midline and oropharynx is clear and moist.  Neck: Normal range of motion. Neck supple.  Cardiovascular: Normal rate, regular rhythm, normal heart sounds and normal pulses.  Pulmonary/Chest: Effort normal and breath sounds normal.  Abdominal: Soft. Bowel sounds are normal.  Musculoskeletal: Normal range of motion.  Lymphadenopathy:       Head (right side): No submental and no submandibular adenopathy present.       Head (left side): No submental and no  submandibular adenopathy present.    She has no cervical adenopathy.  Neurological: She is alert and oriented to person, place, and time.  Psychiatric: She has a normal mood and affect.  Vitals reviewed. BV Amsel's Criteria: PH paper + for pH of 5+, whiff test positive, grey/white discharge   A: 1. BV (bacterial vaginosis)      P: Discussed use of unscented feminine hygiene products and use of probiotics.  Exam findings, diagnosis etiology and medication use and indications reviewed with patient. Follow- Up and discharge instructions provided. No emergent/urgent issues found on exam.  Patient verbalized understanding of information provided and agrees with plan of care (POC), all questions answered.  1. BV (bacterial vaginosis) - metroNIDAZOLE (FLAGYL) 500 MG tablet; Take 1 tablet (500 mg total) by mouth 2 (two) times daily for 7 days.

## 2017-09-18 NOTE — Patient Instructions (Signed)

## 2017-11-04 ENCOUNTER — Encounter: Payer: Self-pay | Admitting: Sports Medicine

## 2017-11-04 ENCOUNTER — Ambulatory Visit (INDEPENDENT_AMBULATORY_CARE_PROVIDER_SITE_OTHER): Payer: 59

## 2017-11-04 ENCOUNTER — Ambulatory Visit: Payer: 59 | Admitting: Sports Medicine

## 2017-11-04 DIAGNOSIS — M79672 Pain in left foot: Secondary | ICD-10-CM | POA: Diagnosis not present

## 2017-11-04 DIAGNOSIS — M2042 Other hammer toe(s) (acquired), left foot: Secondary | ICD-10-CM

## 2017-11-04 DIAGNOSIS — L84 Corns and callosities: Secondary | ICD-10-CM

## 2017-11-04 NOTE — Progress Notes (Signed)
Subjective: Nicole Mccall is a 32 y.o. female patient who presents to office for evaluation of  Left foot pain. Patient complains of progressive pain especially over the last 2 months that is getting worse with shoes and also thinks pain to left medial toe nail from the way she walks, Pain 3/10 and is now interferring with daily activities. Patient has tried changing shoes and toe spacer with no relief in symptoms. Patient denies any other pedal complaints.   Review of Systems  Musculoskeletal:       Toe pain with corn  All other systems reviewed and are negative.    Patient Active Problem List   Diagnosis Date Noted  . Skier's thumb, left, initial encounter 03/07/2017  . Benign essential HTN 04/16/2016  . 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 Biotin w/ Vitamins C & E (HAIR/SKIN/NAILS PO) Take 1 tablet by mouth daily.    . ferrous sulfate 325 (65 FE) MG tablet Take 325 mg by mouth daily with breakfast.    . hydrochlorothiazide (MICROZIDE) 12.5 MG capsule Take 12.5 mg by mouth daily.    Marland Kitchen ibuprofen (ADVIL,MOTRIN) 200 MG tablet Take 400-600 mg by mouth every 8 (eight) hours as needed for mild pain or moderate pain.    . Multiple Vitamins-Minerals (MULTIVITAMIN WITH MINERALS) tablet Take 1 tablet by mouth daily.    . norethindrone-ethinyl estradiol (JUNEL FE,GILDESS FE,LOESTRIN FE) 1-20 MG-MCG tablet Take 1 tablet by mouth daily.    . Probiotic Product (PROBIOTIC DAILY PO) Take 1 capsule by mouth daily.     No current facility-administered medications on file prior to visit.     No Known Allergies  Objective:  General: Alert and oriented x3 in no acute distress  Dermatology: Small hyperkeratotic lesion overlying 2-5 PIPJ dorsally bilateral L>R with soft corn at 4th toe lateral aspect. No open lesions bilateral lower extremities, no webspace macerations, no ecchymosis bilateral, all nails x 10 are well  manicured.  Vascular: Dorsalis Pedis and Posterior Tibial pedal pulses 2/4, Capillary Fill Time 3 seconds,(+) pedal hair growth bilateral, no edema bilateral lower extremities, Temperature gradient within normal limits.  Neurology: Gross sensation intact via light touch bilateral.  Musculoskeletal: Semi-flexible hammertoes 2-5 with Mild tenderness with palpation at PIPJ Left>right, Ankle, Subtalar, Midtarsal, and MTPJ joint range of motion is within normal limits, there is no 1st ray hypermobility noted bilateral, Early bunion deformity noted bilateral. No pain with calf compression bilateral.  Strength within normal limits in all groups bilateral.   Gait: Unassisted, Non-antalgic.  Xrays  Left Foot    Impression: Early bunion and lesser hammertoes        Assessment and Plan: Problem List Items Addressed This Visit    None    Visit Diagnoses    Hammer toe of left foot    -  Primary   Relevant Orders   DG Foot Complete Left   Left foot pain       Corn of toe           -Complete examination performed -No treatment needed at left great toenail since there is no acute ingrowing or signs of infection -Advised comfortable shoes -Xrays reviewed -Discussed treatement options for hammertoe with soft corn -Rx toe cushions -Patient to return to office as needed or sooner if condition worsens. Advised surgery if pain is not better.  Landis Martins, DPM

## 2018-01-01 IMAGING — DX DG FINGER THUMB 2+V*L*
3 series · 3 of 3 positions shown · non-contrast
Comparison: None.

CLINICAL DATA: Pain following fall

EXAM:
LEFT THUMB 2+V

[finger ap]
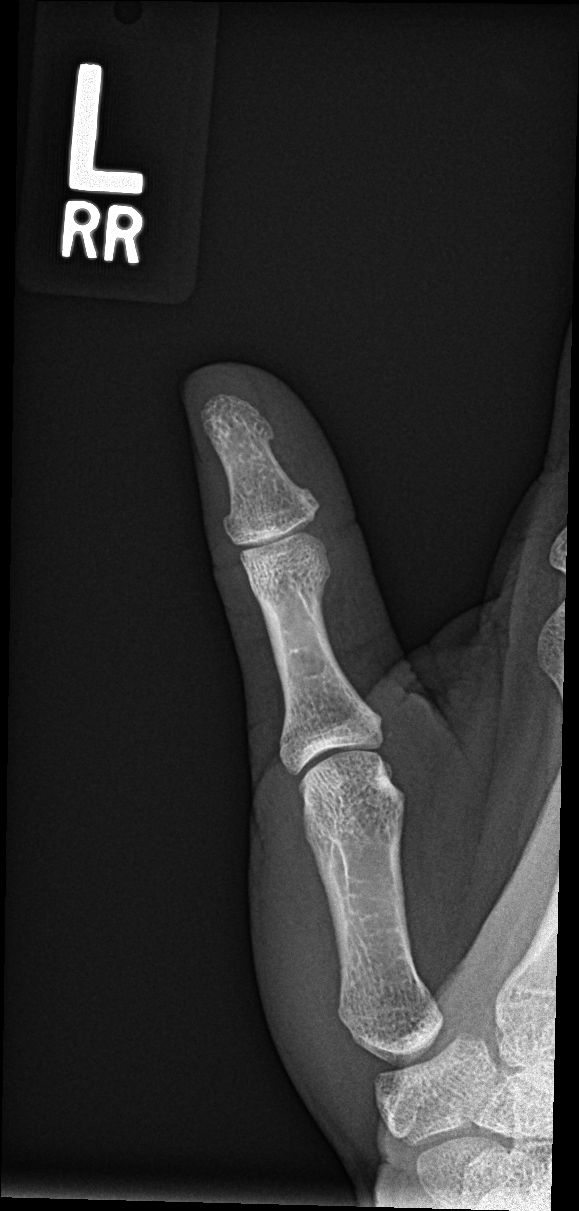

[finger obl]
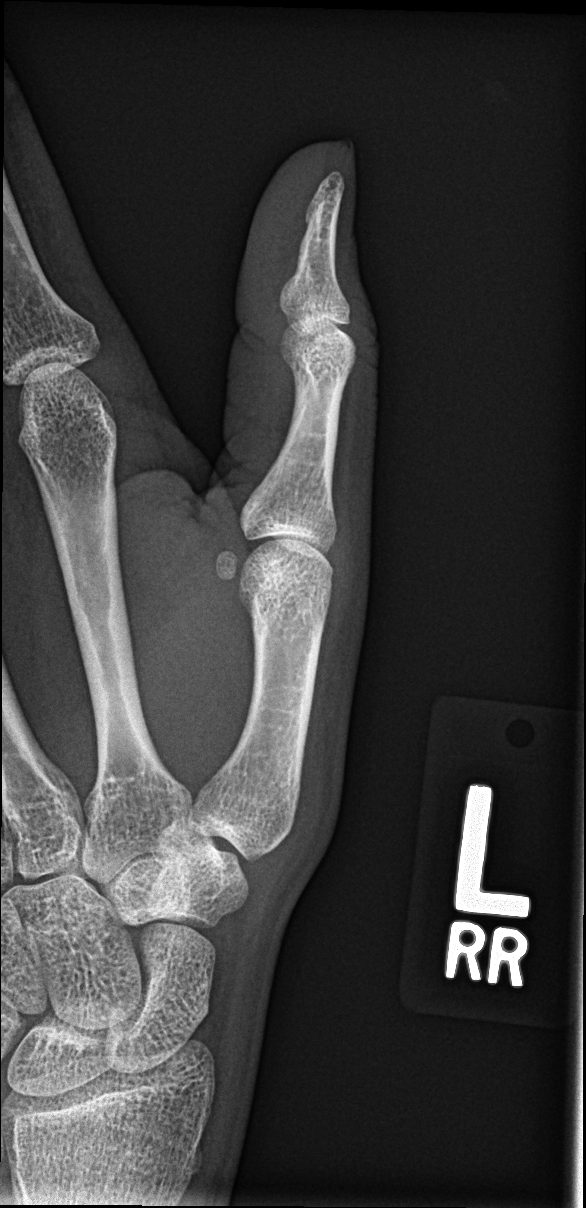

[finger lat]
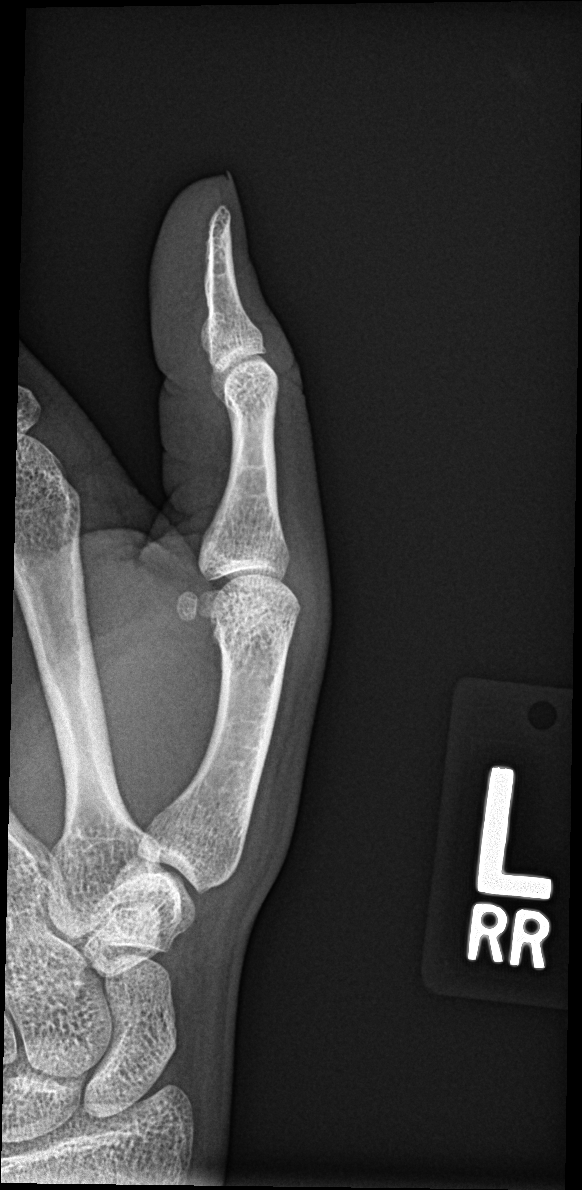

[3 of 3 positions shown; findings below may reference images not displayed]

FINDINGS: Frontal, oblique, and lateral views were obtained. A tiny
calcification is noted medial to the proximal most aspect of the
first proximal phalanx. Suspect tiny avulsion. No other evidence
suggesting fracture. No dislocation. Joint spaces appear normal. No
erosive change.
IMPRESSION: Tiny avulsion medial to the proximal aspect of the first proximal
phalanx. No other fracture. No dislocation. No evident arthropathy.

These results will be called to the ordering clinician or
representative by the Radiologist Assistant, and communication
documented in the PACS or zVision Dashboard.

## 2018-01-23 MED FILL — NITROFURANTOIN MONO-MCR 100: 100 | 5 days supply | Qty: 10 | Fill #0

## 2018-03-20 MED FILL — FLUCONAZOLE 150 MG TABS: 150 | 7 days supply | Qty: 2 | Fill #0

## 2018-03-24 ENCOUNTER — Other Ambulatory Visit: Payer: Self-pay

## 2018-03-24 ENCOUNTER — Ambulatory Visit: Payer: 59 | Admitting: Physician Assistant

## 2018-03-24 ENCOUNTER — Encounter: Payer: Self-pay | Admitting: Physician Assistant

## 2018-03-24 VITALS — BP 155/94 | HR 93 | Temp 98.2°F | Resp 16 | Ht 64.0 in | Wt 160.2 lb

## 2018-03-24 DIAGNOSIS — B3731 Acute candidiasis of vulva and vagina: Secondary | ICD-10-CM

## 2018-03-24 DIAGNOSIS — N898 Other specified noninflammatory disorders of vagina: Secondary | ICD-10-CM

## 2018-03-24 DIAGNOSIS — R300 Dysuria: Secondary | ICD-10-CM

## 2018-03-24 DIAGNOSIS — B373 Candidiasis of vulva and vagina: Secondary | ICD-10-CM

## 2018-03-24 DIAGNOSIS — B9689 Other specified bacterial agents as the cause of diseases classified elsewhere: Secondary | ICD-10-CM | POA: Diagnosis not present

## 2018-03-24 DIAGNOSIS — N760 Acute vaginitis: Secondary | ICD-10-CM | POA: Diagnosis not present

## 2018-03-24 LAB — POCT WET + KOH PREP
Trich by wet prep: ABSENT
Yeast by KOH: ABSENT
Yeast by wet prep: ABSENT

## 2018-03-24 MED ORDER — FLUCONAZOLE 150 MG PO TABS: 150.0000 mg | ORAL_TABLET | Freq: Once | ORAL | 0 refills | Status: AC

## 2018-03-24 MED ORDER — METRONIDAZOLE 500 MG PO TABS: 500.0000 mg | ORAL_TABLET | Freq: Two times a day (BID) | ORAL | 0 refills | Status: DC

## 2018-03-24 MED FILL — metroNIDAZOLE 500 MG TABS: 500 | 7 days supply | Qty: 14 | Fill #0

## 2018-03-24 NOTE — Patient Instructions (Addendum)
Your labs are positive for BV. Start taking Flagyl x 7 days. Do not drink alcohol while taking this medication. I gave you a  prescription for Diflucan - take this if you feel you have a yeast infection after you finish the Flagyl.     If you have lab work done today you will be contacted with your lab results within the next 2 weeks.  If you have not heard from [place] then please contact us. The fastest way to get your results is to register for My Chart.   Bacterial Vaginosis Bacterial vaginosis is a vaginal infection that occurs when the normal balance of bacteria in the vagina is disrupted. It results from an overgrowth of certain bacteria. This is the most common vaginal infection among women ages 29-44. Because bacterial vaginosis increases your risk for STIs (sexually transmitted infections), getting treated can help reduce your risk for chlamydia, gonorrhea, herpes, and HIV (human immunodeficiency virus). Treatment is also important for preventing complications in pregnant women, because this condition can cause an early (premature) delivery. What are the causes? This condition is caused by an increase in harmful bacteria that are normally present in small amounts in the vagina. However, the reason that the condition develops is not fully understood. What increases the risk? The following factors may make you more likely to develop this condition:  Having a new sexual partner or multiple sexual partners.  Having unprotected sex.  Douching.  Having an intrauterine device (IUD).  Smoking.  Drug and alcohol abuse.  Taking certain antibiotic medicines.  Being pregnant.  You cannot get bacterial vaginosis from toilet seats, bedding, swimming pools, or contact with objects around you. What are the signs or symptoms? Symptoms of this condition include:  Grey or white vaginal discharge. The discharge can also be watery or foamy.  A fish-like odor with discharge, especially after  sexual intercourse or during menstruation.  Itching in and around the vagina.  Burning or pain with urination.  Some women with bacterial vaginosis have no signs or symptoms. How is this diagnosed? This condition is diagnosed based on:  Your medical history.  A physical exam of the vagina.  Testing a sample of vaginal fluid under a microscope to look for a large amount of bad bacteria or abnormal cells. Your health care provider may use a cotton swab or a small wooden spatula to collect the sample.  How is this treated? This condition is treated with antibiotics. These may be given as a pill, a vaginal cream, or a medicine that is put into the vagina (suppository). If the condition comes back after treatment, a second round of antibiotics may be needed. Follow these instructions at home: Medicines  Take over-the-counter and prescription medicines only as told by your health care provider.  Take or use your antibiotic as told by your health care provider. Do not stop taking or using the antibiotic even if you start to feel better. General instructions  If you have a female sexual partner, tell her that you have a vaginal infection. She should see her health care provider and be treated if she has symptoms. If you have a female sexual partner, he does not need treatment.  During treatment: ? Avoid sexual activity until you finish treatment. ? Do not douche. ? Avoid alcohol as directed by your health care provider. ? Avoid breastfeeding as directed by your health care provider.  Drink enough water and fluids to keep your urine clear or pale yellow.  Keep  the area around your vagina and rectum clean. ? Wash the area daily with warm water. ? Wipe yourself from front to back after using the toilet.  Keep all follow-up visits as told by your health care provider. This is important. How is this prevented?  Do not douche.  Wash the outside of your vagina with warm water only.  Use  protection when having sex. This includes latex condoms and dental dams.  Limit how many sexual partners you have. To help prevent bacterial vaginosis, it is best to have sex with just one partner (monogamous).  Make sure you and your sexual partner are tested for STIs.  Wear cotton or cotton-lined underwear.  Avoid wearing tight pants and pantyhose, especially during summer.  Limit the amount of alcohol that you drink.  Do not use any products that contain nicotine or tobacco, such as cigarettes and e-cigarettes. If you need help quitting, ask your health care provider.  Do not use illegal drugs. Where to find more information:  Centers for Disease Control and Prevention: AppraiserFraud.fi  American Sexual Health Association (ASHA): www.ashastd.org  [place].S. Department of Health and Financial controller, Office on Women's Health: DustingSprays.pl or SecuritiesCard.it Contact a health care provider if:  Your symptoms do not improve, even after treatment.  You have more discharge or pain when urinating.  You have a fever.  You have pain in your abdomen.  You have pain during sex.  You have vaginal bleeding between periods. Summary  Bacterial vaginosis is a vaginal infection that occurs when the normal balance of bacteria in the vagina is disrupted.  Because bacterial vaginosis increases your risk for STIs (sexually transmitted infections), getting treated can help reduce your risk for chlamydia, gonorrhea, herpes, and HIV (human immunodeficiency virus). Treatment is also important for preventing complications in pregnant women, because the condition can cause an early (premature) delivery.  This condition is treated with antibiotic medicines. These may be given as a pill, a vaginal cream, or a medicine that is put into the vagina (suppository). This information is not intended to replace advice given to you by your health care provider. Make  sure you discuss any questions you have with your health care provider. Document Released: 04/22/2005 Document Revised: 08/26/2016 Document Reviewed: 01/06/2016 Elsevier Interactive Patient Education  [employer].  Thank you for coming in today. I hope you feel we met your needs.  Feel free to call PCP if you have any questions or further requests.  Please consider signing up for MyChart if you do not already have it, as this is a great way to communicate with me.  Best,  Whitney McVey, PA-C  IF you received an x-ray today, you will receive an invoice from [facility]. Please contact [facility] at [phone] with questions or concerns regarding your invoice.   IF you received labwork today, you will receive an invoice from [facility]. Please contact [facility] at [phone] with questions or concerns regarding your invoice.   Our billing staff will not be able to assist you with questions regarding bills from these companies.  You will be contacted with the lab results as soon as they are available. The fastest way to get your results is to activate your My Chart account. Instructions are located on the last page of this paperwork. If you have not heard from [place] regarding the results in 2 weeks, please contact this office.

## 2018-03-24 NOTE — Progress Notes (Signed)
Nicole Mccall  MRN: [id number] DOB: [birth year]  PCP: Leeroy Cha, MD  Subjective:  Pt is a pleasant 32 year old female who presents to clinic for vaginal discharge x 1 week. Endorses itching.  Started with itching after period.  She has taken one dose of Diflucan about 4 days ago. This did not help. Discharge is thick and yellow "with an odor." "at one point it was a cottage cheese" Endorses burning with urination "Not like UTI burning".  She has h/o BV.  LMP 11/5  Denies blood in urine, back pain, abdominal pain, fever, chills.  Unprotected sex with one female partner x 3 years.   She gets PAPs with [facility].   Review of Systems  Constitutional: Negative for chills, fatigue and fever.  Respiratory: Negative for cough, shortness of breath and wheezing.   Cardiovascular: Negative for chest pain and palpitations.  Gastrointestinal: Negative for abdominal pain, diarrhea, nausea and vomiting.  Genitourinary: Positive for dysuria and vaginal discharge. Negative for decreased urine volume, difficulty urinating, enuresis, flank pain, frequency, hematuria, urgency, vaginal bleeding and vaginal pain.  Musculoskeletal: Negative for back pain.  Neurological: Negative for dizziness, weakness, light-headedness and headaches.    Patient Active Problem List   Diagnosis Date Noted  . Skier's thumb, left, initial encounter 03/07/2017  . Benign essential HTN 04/16/2016  . 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 Biotin w/ Vitamins C & E (HAIR/SKIN/NAILS PO) Take 1 tablet by mouth daily.    . ferrous sulfate 325 (65 FE) MG tablet Take 325 mg by mouth daily with breakfast.    . ibuprofen (ADVIL,MOTRIN) 200 MG tablet Take 400-600 mg by mouth every 8 (eight) hours as needed for mild pain or moderate pain.    . Multiple Vitamins-Minerals (MULTIVITAMIN WITH MINERALS) tablet Take 1 tablet by mouth daily.    .  Probiotic Product (PROBIOTIC DAILY PO) Take 1 capsule by mouth daily.    . hydrochlorothiazide (MICROZIDE) 12.5 MG capsule Take 12.5 mg by mouth daily.    . norethindrone-ethinyl estradiol (JUNEL FE,GILDESS FE,LOESTRIN FE) 1-20 MG-MCG tablet Take 1 tablet by mouth daily.     No current facility-administered medications on file prior to visit.     No Known Allergies   Objective:  BP (!) 155/94 (BP Location: Right Arm, Patient Position: Sitting, Cuff Size: Normal)   Pulse 93   Temp 98.2 F (36.8 C) (Oral)   Resp 16   Ht 5\' 4"  (1.626 m)   Wt 160 lb 3.2 oz (72.7 kg)   LMP 03/10/2018   SpO2 99%   BMI 27.50 kg/m   Physical Exam  Constitutional: She appears well-developed and well-nourished.  Genitourinary: Vagina normal. Cervix exhibits no motion tenderness and no discharge.  Genitourinary Comments: Thick white patchy discharge adhered to vaginal walls.   Skin: Skin is warm and dry.  Psychiatric: She has a normal mood and affect. Thought content normal.  Vitals reviewed.  Results for orders placed or performed in visit on 03/24/18  POCT Wet + KOH Prep  Result Value Ref Range   Yeast by KOH Absent Absent   Yeast by wet prep Absent Absent   WBC by wet prep Few Few   Clue Cells Wet Prep HPF POC Many (A) None   Trich by wet prep Absent Absent   Bacteria Wet Prep HPF POC Many (A) Few   Epithelial Cells By [employer] Pref ([facility]) Few None, Few,  Too numerous to count   RBC,UR,HPF,POC None None RBC/hpf    Assessment and Plan :  1. Bacterial vaginitis 2. Vaginal discharge - positive clue cells on wet prep, will treat for BV. STD panel and urine culture are pending. Will contact with results.  - metroNIDAZOLE (FLAGYL) 500 MG tablet; Take 1 tablet (500 mg total) by mouth 2 (two) times daily with a meal. DO NOT CONSUME ALCOHOL WHILE TAKING THIS MEDICATION.  Dispense: 14 tablet; Refill: 0 - POCT Wet + KOH Prep - Chlamydia/Gonococcus/Trichomonas, NAA  3. Yeast vaginitis - fluconazole  (DIFLUCAN) 150 MG tablet; Take 1 tablet (150 mg total) by mouth once for 1 dose. Repeat if needed  Dispense: 2 tablet; Refill: 0   Mercer Pod, PA-C  [facility] 03/24/2018 4:23 PM  Please note: Portions of this report may have been transcribed using dragon voice recognition software. Every effort was made to ensure accuracy; however, inadvertent computerized transcription errors may be present.

## 2018-03-28 LAB — CHLAMYDIA/GONOCOCCUS/TRICHOMONAS, NAA
Chlamydia by NAA: NEGATIVE
Gonococcus by NAA: NEGATIVE
Trich vag by NAA: NEGATIVE

## 2018-04-14 DIAGNOSIS — Z3009 Encounter for other general counseling and advice on contraception: Secondary | ICD-10-CM | POA: Diagnosis not present

## 2018-04-14 DIAGNOSIS — N760 Acute vaginitis: Secondary | ICD-10-CM | POA: Diagnosis not present

## 2018-04-14 DIAGNOSIS — Z01411 Encounter for gynecological examination (general) (routine) with abnormal findings: Secondary | ICD-10-CM | POA: Diagnosis not present

## 2018-04-23 MED FILL — metroNIDAZOLE 500 MG TABS: 500 | 7 days supply | Qty: 14 | Fill #0

## 2018-05-22 ENCOUNTER — Inpatient Hospital Stay (HOSPITAL_COMMUNITY): Payer: 59

## 2018-05-22 ENCOUNTER — Other Ambulatory Visit: Payer: Self-pay

## 2018-05-22 ENCOUNTER — Inpatient Hospital Stay (HOSPITAL_COMMUNITY)
Admission: AD | Admit: 2018-05-22 | Discharge: 2018-05-22 | Disposition: A | Discharge status: Home / Self Care | Payer: 59 | Attending: Obstetrics & Gynecology | Admitting: Obstetrics & Gynecology

## 2018-05-22 ENCOUNTER — Encounter (HOSPITAL_COMMUNITY): Payer: Self-pay | Admitting: *Deleted

## 2018-05-22 DIAGNOSIS — O3680X0 Pregnancy with inconclusive fetal viability, not applicable or unspecified: Secondary | ICD-10-CM | POA: Diagnosis not present

## 2018-05-22 DIAGNOSIS — O209 Hemorrhage in early pregnancy, unspecified: Secondary | ICD-10-CM | POA: Insufficient documentation

## 2018-05-22 DIAGNOSIS — Z3A00 Weeks of gestation of pregnancy not specified: Secondary | ICD-10-CM | POA: Diagnosis not present

## 2018-05-22 DIAGNOSIS — Z3A01 Less than 8 weeks gestation of pregnancy: Secondary | ICD-10-CM | POA: Diagnosis not present

## 2018-05-22 HISTORY — DX: Sickle-cell trait: D57.3

## 2018-05-22 HISTORY — DX: Thyroiditis, unspecified: E06.9

## 2018-05-22 HISTORY — DX: Benign neoplasm of connective and other soft tissue, unspecified: D21.9

## 2018-05-22 LAB — CBC
HCT: 37.8 % (ref 36.0–46.0)
Hemoglobin: 13.5 g/dL (ref 12.0–15.0)
MCH: 31 pg (ref 26.0–34.0)
MCHC: 35.7 g/dL (ref 30.0–36.0)
MCV: 86.7 fL (ref 80.0–100.0)
NRBC: 0 % (ref 0.0–0.2)
PLATELETS: 272 10*3/uL (ref 150–400)
RBC: 4.36 MIL/uL (ref 3.87–5.11)
RDW: 13.9 % (ref 11.5–15.5)
WBC: 6.2 10*3/uL (ref 4.0–10.5)

## 2018-05-22 LAB — HCG, QUANTITATIVE, PREGNANCY: hCG, Beta Chain, Quant, S: 141 m[IU]/mL — ABNORMAL HIGH (ref <5)

## 2018-05-22 LAB — URINALYSIS, ROUTINE W REFLEX MICROSCOPIC
BACTERIA UA: NONE SEEN
Bilirubin Urine: NEGATIVE
GLUCOSE, UA: NEGATIVE mg/dL
KETONES UR: NEGATIVE mg/dL
Leukocytes, UA: NEGATIVE
Nitrite: NEGATIVE
PROTEIN: NEGATIVE mg/dL
Specific Gravity, Urine: 1.02 (ref 1.005–1.030)
pH: 5 (ref 5.0–8.0)

## 2018-05-22 LAB — ABO/RH: ABO/RH(D): O POS

## 2018-05-22 LAB — POCT PREGNANCY, URINE: PREG TEST UR: POSITIVE — AB

## 2018-05-22 NOTE — MAU Provider Note (Signed)
Chief Complaint: Vaginal Bleeding; Abdominal Pain; and Possible Pregnancy   First Provider Initiated Contact with Patient 05/22/18 1216     SUBJECTIVE HPI: Nicole Mccall is a 33 y.o. G2P0010 at [redacted]w[redacted]d who presents to Maternity Admissions reporting vaginal bleeding. Reports episode of spotting last week that resolved. Then started bleeding heavily on Wednesday night. Bleeding has slowed down some but still has to change a pad every 2-3 hours. Endorses some lower abdominal cramping that is intermittent. Has not been seen yet with this pregnancy. Normally sees Dr. Nelda Marseille for her gyn care, whom she last say 04/2018 for her annual.   Location: lower abdomen Quality: cramping Severity: 2/10 on pain scale Duration: 3 days Timing: intermittent Modifying factors: none Associated signs and symptoms: vaginal bleeding  Past Medical History:  Diagnosis Date  . Anemia   . Anxiety    no meds  . Fibroids   . GERD (gastroesophageal reflux disease)    diet controlled - no med  . Hypertension   . Sickle cell trait ([place])   . Thyroiditis    OB History  Gravida Para Term Preterm AB Living  2 0     1    SAB TAB Ectopic Multiple Live Births    1          # Outcome Date GA Lbr Len/2nd Weight Sex Delivery Anes PTL Lv  2 Current           1 TAB            Past Surgical History:  Procedure Laterality Date  . HYSTEROSCOPY N/A 12/06/2016   Procedure: HYSTEROSCOPY;  Surgeon: Janyth Pupa, DO;  Location: [facility];  Service: Gynecology;  Laterality: N/A;  . WISDOM TOOTH EXTRACTION     Social History   Socioeconomic History  . Marital status: Single    Spouse name: Not on file  . Number of children: Not on file  . Years of education: Not on file  . Highest education level: Not on file  Occupational History  . Not on file  Social Needs  . Financial resource strain: Not on file  . Food insecurity:    Worry: Not on file    Inability: Not on file  . Transportation needs:    Medical: Not on file    Non-medical: Not on file  Tobacco Use  . Smoking status: Never Smoker  . Smokeless tobacco: Never Used  Substance and Sexual Activity  . Alcohol use: Not Currently    Alcohol/week: 0.0 standard drinks  . Drug use: No  . Sexual activity: Yes    Birth control/protection: Pill  Lifestyle  . Physical activity:    Days per week: Not on file    Minutes per session: Not on file  . Stress: Not on file  Relationships  . Social connections:    Talks on phone: Not on file    Gets together: Not on file    Attends religious service: Not on file    Active member of club or organization: Not on file    Attends meetings of clubs or organizations: Not on file    Relationship status: Not on file  . Intimate partner violence:    Fear of current or ex partner: Not on file    Emotionally abused: Not on file    Physically abused: Not on file    Forced sexual activity: Not on file  Other Topics Concern  . Not on file  Social History Narrative  .  Not on file   Family History  Problem Relation Age of Onset  . Cancer Mother   . Cancer Father   . Hyperlipidemia Father   . Diabetes Maternal Grandmother   . Stroke Maternal Grandmother   . Hypertension Maternal Grandfather   . Mental illness Maternal Grandfather    No current facility-administered medications on file prior to encounter.    Current Outpatient Medications on File Prior to Encounter  Medication Sig Dispense Refill  . Biotin w/ Vitamins C & E (HAIR/SKIN/NAILS PO) Take 1 tablet by mouth daily.    . ferrous sulfate 325 (65 FE) MG tablet Take 325 mg by mouth daily with breakfast.    . hydrochlorothiazide (MICROZIDE) 12.5 MG capsule Take 12.5 mg by mouth daily.    . Multiple Vitamins-Minerals (MULTIVITAMIN WITH MINERALS) tablet Take 1 tablet by mouth daily.    . Probiotic Product (PROBIOTIC DAILY PO) Take 1 capsule by mouth daily.     No Known Allergies  I have reviewed patient's Past Medical Hx, Surgical Hx, Family Hx, Social Hx,  medications and allergies.   Review of Systems  Constitutional: Negative.   Gastrointestinal: Positive for abdominal pain.  Genitourinary: Positive for vaginal bleeding.    OBJECTIVE Patient Vitals for the past 24 hrs:  BP Temp Temp src Pulse Resp SpO2 Height Weight  05/22/18 1417 (!) 134/102 - - 89 16 - - -  05/22/18 1214 127/85 - - 86 16 - - -  05/22/18 1107 (!) 149/96 98.1 F (36.7 C) Oral 76 16 100 % 5\' 4"  (1.626 m) 73.9 kg   Constitutional: Well-developed, well-nourished female in no acute distress.  Cardiovascular: normal rate & rhythm, no murmur Respiratory: normal rate and effort. Lung sounds clear throughout GI: Abd soft, non-tender, Pos BS x 4. No guarding or rebound tenderness MS: Extremities nontender, no edema, normal ROM Neurologic: Alert and oriented x 4.  GU:     SPECULUM EXAM: NEFG, small amount of dark red blood. Cervix pink/smooth  BIMANUAL: No CMT. cervix closed; uterus normal size, no adnexal tenderness or masses.    LAB RESULTS Results for orders placed or performed during the hospital encounter of 05/22/18 (from the past 24 hour(s))  Urinalysis, Routine w reflex microscopic     Status: Abnormal   Collection Time: 05/22/18 11:22 AM  Result Value Ref Range   Color, Urine YELLOW YELLOW   APPearance CLEAR CLEAR   Specific Gravity, Urine 1.020 1.005 - 1.030   pH 5.0 5.0 - 8.0   Glucose, UA NEGATIVE NEGATIVE mg/dL   Hgb urine dipstick LARGE (A) NEGATIVE   Bilirubin Urine NEGATIVE NEGATIVE   Ketones, ur NEGATIVE NEGATIVE mg/dL   Protein, ur NEGATIVE NEGATIVE mg/dL   Nitrite NEGATIVE NEGATIVE   Leukocytes, UA NEGATIVE NEGATIVE   RBC / HPF 11-20 0 - 5 RBC/hpf   WBC, UA 0-5 0 - 5 WBC/hpf   Bacteria, UA NONE SEEN NONE SEEN   Squamous Epithelial / LPF 0-5 0 - 5   Mucus PRESENT   Pregnancy, urine POC     Status: Abnormal   Collection Time: 05/22/18 11:25 AM  Result Value Ref Range   Preg Test, Ur POSITIVE (A) NEGATIVE  CBC     Status: None   Collection  Time: 05/22/18 12:46 PM  Result Value Ref Range   WBC 6.2 4.0 - 10.5 K/uL   RBC 4.36 3.87 - 5.11 MIL/uL   Hemoglobin 13.5 12.0 - 15.0 g/dL   HCT 37.8 36.0 - 46.0 %  MCV 86.7 80.0 - 100.0 fL   MCH 31.0 26.0 - 34.0 pg   MCHC 35.7 30.0 - 36.0 g/dL   RDW 13.9 11.5 - 15.5 %   Platelets 272 150 - 400 K/uL   nRBC 0.0 0.0 - 0.2 %  ABO/Rh     Status: None   Collection Time: 05/22/18 12:46 PM  Result Value Ref Range   ABO/RH(D)      O POS Performed at [facility], [address], [place], [place] [address]   hCG, quantitative, pregnancy     Status: Abnormal   Collection Time: 05/22/18 12:46 PM  Result Value Ref Range   hCG, Beta Chain, Quant, S 141 (H) <5 mIU/mL    IMAGING US Ob Less Than 14 Weeks With Ob Transvaginal  Result Date: 05/22/2018 CLINICAL DATA:  Vaginal bleeding in first trimester of pregnancy; LMP 04/05/2018. No quantitative beta HCG for correlation EXAM: OBSTETRIC <14 WK [place] AND TRANSVAGINAL OB US TECHNIQUE: Both transabdominal and transvaginal ultrasound examinations were performed for complete evaluation of the gestation as well as the maternal uterus, adnexal regions, and pelvic cul-de-sac. Transvaginal technique was performed to assess early pregnancy. COMPARISON:  None for this gestation FINDINGS: Intrauterine gestational sac: Absent Yolk sac:  N/A Embryo:  N/A Cardiac Activity: N/A Heart Rate: N/A  bpm MSD:   mm    w     d CRL:    mm    w    d                  [place] EDC: Subchorionic hemorrhage:  N/A Maternal uterus/adnexae: Uterus normal size and morphology. No evidence of uterine mass, fluid collection or gestational sac. Unremarkable appearance of the endometrial complex without endometrial fluid. RIGHT ovary normal size and morphology 2.5 x 1.7 x 1.9 cm. LEFT ovary normal size and morphology, 3.1 x 1.7 x 2.3 cm. No free pelvic fluid or adnexal masses. IMPRESSION: No intrauterine gestation identified. Findings are compatible with pregnancy of unknown location. Differential  diagnosis includes early intrauterine pregnancy too early to visualize, spontaneous abortion, and ectopic pregnancy. Serial quantitative beta hCG and or followup ultrasound recommended to definitively exclude ectopic pregnancy. Electronically Signed   By: Lavonia Dana M.D.   On: 05/22/2018 13:36    MAU COURSE Orders Placed This Encounter  Procedures  . US OB LESS THAN 14 WEEKS WITH OB TRANSVAGINAL  . Urinalysis, Routine w reflex microscopic  . CBC  . hCG, quantitative, pregnancy  . Pregnancy, urine POC  . ABO/Rh  . Discharge patient   No orders of the defined types were placed in this encounter.   MDM +UPT UA, CBC, ABO/Rh, quant hCG, and [place] today to rule out ectopic pregnancy RH positive HCG 141. Ultrasound shows no IUP or adnexal mass.   S/w [facility] office, scheduled pt for f/u hcg on Monday in the office (pt can't come to MAU on Sunday d/t her work schedule)    ASSESSMENT 1. Pregnancy of unknown anatomic location   2. Vaginal bleeding in pregnancy, first trimester     PLAN Discharge home in stable condition. SAB vs ectopic precautions Follow-up Information    Gynecology, [facility] on 05/25/2018.   Specialty:  Obstetrics and Gynecology Why:  at 130 pm Contact information: [address] [place] [address] [phone]          Allergies as of 05/22/2018   No Known Allergies     Medication List  STOP taking these medications   ibuprofen 200 MG tablet Commonly known as:  ADVIL,MOTRIN   metroNIDAZOLE 500 MG tablet Commonly known as:  FLAGYL   norethindrone-ethinyl estradiol 1-20 MG-MCG tablet Commonly known as:  JUNEL FE,GILDESS FE,LOESTRIN FE     TAKE these medications   ferrous sulfate 325 (65 FE) MG tablet Take 325 mg by mouth daily with breakfast.   HAIR/SKIN/NAILS PO Take 1 tablet by mouth daily.   hydrochlorothiazide 12.5 MG capsule Commonly known as:  MICROZIDE Take 12.5 mg by mouth daily.   multivitamin  with minerals tablet Take 1 tablet by mouth daily.   PROBIOTIC DAILY PO Take 1 capsule by mouth daily.        Jorje Guild, NP 05/22/2018  3:58 PM

## 2018-05-22 NOTE — MAU Note (Signed)
Dr sent her iin for eval, possible miscarriage.  On Wed, started having really really bad cramping, passing blood clots, still bleeding. Has not been seen in office with preg. No pain right now, took something earlier.

## 2018-05-22 NOTE — Discharge Instructions (Signed)
Vaginal Bleeding During Pregnancy, First Trimester ° °A small amount of bleeding from the vagina (spotting) is relatively common during early pregnancy. It usually stops on its own. Various things may cause bleeding or spotting during early pregnancy. Some bleeding may be related to the pregnancy, and some may not. In many cases, the bleeding is normal and is not a problem. However, bleeding can also be a sign of something serious. Be sure to tell your health care provider about any vaginal bleeding right away. °Some possible causes of vaginal bleeding during the first trimester include: °· Infection or inflammation of the cervix. °· Growths (polyps) on the cervix. °· Miscarriage or threatened miscarriage. °· Pregnancy tissue developing outside of the uterus (ectopic pregnancy). °· A mass of tissue developing in the uterus due to an egg being fertilized incorrectly (molar pregnancy). °Follow these instructions at home: °Activity °· Follow instructions from your health care provider about limiting your activity. Ask what activities are safe for you. °· If needed, make plans for someone to help with your regular activities. °· Do not have sex or orgasms until your health care provider says that this is safe. °General instructions °· Take over-the-counter and prescription medicines only as told by your health care provider. °· Pay attention to any changes in your symptoms. °· Do not use tampons or douche. °· Write down how many pads you use each day, how often you change pads, and how soaked (saturated) they are. °· If you pass any tissue from your vagina, save the tissue so you can show it to your health care provider. °· Keep all follow-up visits as told by your health care provider. This is important. °Contact a health care provider if: °· You have vaginal bleeding during any part of your pregnancy. °· You have cramps or labor pains. °· You have a fever. °Get help right away if: °· You have severe cramps in your  back or abdomen. °· You pass large clots or a large amount of tissue from your vagina. °· Your bleeding increases. °· You feel light-headed or weak, or you faint. °· You have chills. °· You are leaking fluid or have a gush of fluid from your vagina. °Summary °· A small amount of bleeding (spotting) from the vagina is relatively common during early pregnancy. °· Various things may cause bleeding or spotting in early pregnancy. °· Be sure to tell your health care provider about any vaginal bleeding right away. °This information is not intended to replace advice given to you by your health care provider. Make sure you discuss any questions you have with your health care provider. °Document Released: 01/30/2005 Document Revised: 07/25/2016 Document Reviewed: 07/25/2016 °Elsevier Interactive Patient Education © 2019 [employer] ° °

## 2018-05-25 DIAGNOSIS — Z3200 Encounter for pregnancy test, result unknown: Secondary | ICD-10-CM | POA: Diagnosis not present

## 2018-06-15 DIAGNOSIS — O021 Missed abortion: Secondary | ICD-10-CM | POA: Diagnosis not present

## 2018-06-15 MED FILL — metroNIDAZOLE 500 MG TABS: 500 | 7 days supply | Qty: 14 | Fill #1 | Status: TO

## 2018-06-19 ENCOUNTER — Ambulatory Visit (INDEPENDENT_AMBULATORY_CARE_PROVIDER_SITE_OTHER): Payer: Self-pay | Admitting: Nurse Practitioner

## 2018-06-19 DIAGNOSIS — Z111 Encounter for screening for respiratory tuberculosis: Secondary | ICD-10-CM

## 2018-06-19 NOTE — Progress Notes (Signed)
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  Patient informed to return to [facility] in 48-72 hours for PPD read. Vaccine Information Statement provided to patient.

## 2018-06-21 LAB — TB SKIN TEST
Induration: 0 mm
TB SKIN TEST: NEGATIVE

## 2018-08-24 MED FILL — METRONIDAZOLE 500 MG TABS: 500 | 7 days supply | Qty: 14 | Fill #0

## 2018-12-02 DIAGNOSIS — Z124 Encounter for screening for malignant neoplasm of cervix: Secondary | ICD-10-CM | POA: Diagnosis not present

## 2018-12-02 DIAGNOSIS — R102 Pelvic and perineal pain: Secondary | ICD-10-CM | POA: Diagnosis not present

## 2018-12-04 MED FILL — metroNIDAZOLE 500 MG TABS: 500 | 7 days supply | Qty: 14 | Fill #0

## 2018-12-08 DIAGNOSIS — R102 Pelvic and perineal pain: Secondary | ICD-10-CM | POA: Diagnosis not present

## 2018-12-30 ENCOUNTER — Other Ambulatory Visit: Payer: Self-pay | Admitting: Internal Medicine

## 2018-12-30 DIAGNOSIS — Z0000 Encounter for general adult medical examination without abnormal findings: Secondary | ICD-10-CM | POA: Diagnosis not present

## 2018-12-30 DIAGNOSIS — J302 Other seasonal allergic rhinitis: Secondary | ICD-10-CM | POA: Diagnosis not present

## 2018-12-30 DIAGNOSIS — E049 Nontoxic goiter, unspecified: Secondary | ICD-10-CM | POA: Diagnosis not present

## 2019-01-04 ENCOUNTER — Other Ambulatory Visit: Payer: 59

## 2019-01-14 ENCOUNTER — Ambulatory Visit
Admission: RE | Admit: 2019-01-14 | Discharge: 2019-01-14 | Disposition: A | Discharge status: Home / Self Care | Payer: 59 | Source: Ambulatory Visit | Attending: Internal Medicine | Admitting: Internal Medicine

## 2019-01-14 DIAGNOSIS — E042 Nontoxic multinodular goiter: Secondary | ICD-10-CM | POA: Diagnosis not present

## 2019-01-14 DIAGNOSIS — E049 Nontoxic goiter, unspecified: Secondary | ICD-10-CM

## 2019-02-04 DIAGNOSIS — N920 Excessive and frequent menstruation with regular cycle: Secondary | ICD-10-CM | POA: Diagnosis not present

## 2019-02-04 DIAGNOSIS — G43109 Migraine with aura, not intractable, without status migrainosus: Secondary | ICD-10-CM | POA: Diagnosis not present

## 2019-02-04 MED FILL — SUMATRIPTAN SUCC 25 MG TAB: 25 | 30 days supply | Qty: 8 | Fill #0

## 2019-04-04 ENCOUNTER — Encounter (HOSPITAL_COMMUNITY): Payer: Self-pay

## 2019-04-16 DIAGNOSIS — N898 Other specified noninflammatory disorders of vagina: Secondary | ICD-10-CM | POA: Diagnosis not present

## 2019-04-16 DIAGNOSIS — Z01411 Encounter for gynecological examination (general) (routine) with abnormal findings: Secondary | ICD-10-CM | POA: Diagnosis not present

## 2019-04-16 MED FILL — metroNIDAZOLE 500 MG TABS: 500 | 7 days supply | Qty: 14 | Fill #0

## 2019-04-21 ENCOUNTER — Ambulatory Visit: Payer: 59 | Attending: Internal Medicine

## 2019-04-21 ENCOUNTER — Other Ambulatory Visit: Payer: Self-pay

## 2019-04-21 DIAGNOSIS — Z20828 Contact with and (suspected) exposure to other viral communicable diseases: Secondary | ICD-10-CM | POA: Diagnosis not present

## 2019-04-21 DIAGNOSIS — Z20822 Contact with and (suspected) exposure to covid-19: Secondary | ICD-10-CM

## 2019-04-23 ENCOUNTER — Other Ambulatory Visit: Payer: 59

## 2019-04-23 LAB — NOVEL CORONAVIRUS, NAA: SARS-CoV-2, NAA: NOT DETECTED

## 2019-05-12 DIAGNOSIS — I10 Essential (primary) hypertension: Secondary | ICD-10-CM | POA: Diagnosis not present

## 2019-05-12 DIAGNOSIS — G43109 Migraine with aura, not intractable, without status migrainosus: Secondary | ICD-10-CM | POA: Diagnosis not present

## 2019-05-12 MED FILL — SUMATRIPTAN SUCC 25 MG TAB: 25 | 30 days supply | Qty: 8 | Fill #0

## 2019-08-05 MED FILL — SUMATRIPTAN SUCC 25 MG TAB: 25 | 30 days supply | Qty: 8 | Fill #1

## 2019-11-18 MED FILL — metroNIDAZOLE 500 MG TABS: 500 | 7 days supply | Qty: 14 | Fill #1

## 2019-11-18 MED FILL — SUMATRIPTAN SUCC 25 MG TAB: 25 | 30 days supply | Qty: 8 | Fill #2

## 2019-12-10 IMAGING — US US THYROID
1 series · 13 of 25 positions shown · non-contrast
Comparison: None.

CLINICAL DATA: 33-year-old female with a history of enlarged
thyroid

EXAM:
THYROID ULTRASOUND
TECHNIQUE: Ultrasound examination of the thyroid gland and adjacent soft
tissues was performed.

[Series 1: us thyroid · 0.06mm/px · 13 of 40 slices shown]
[im 1/40]
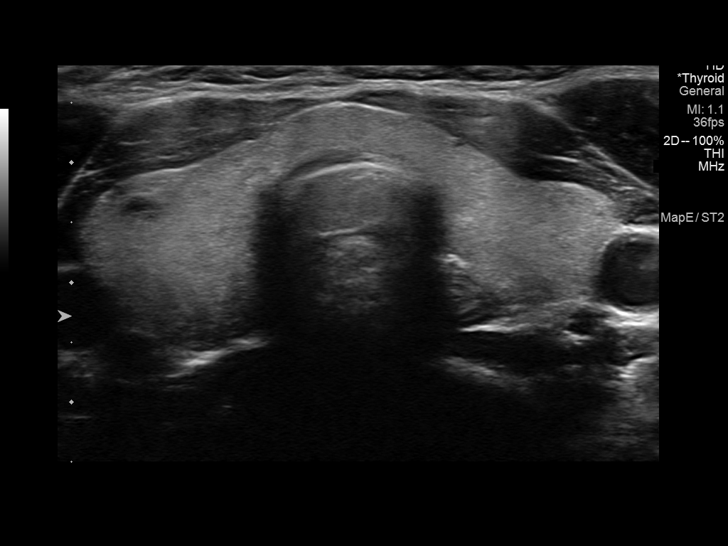
[im 4/40]
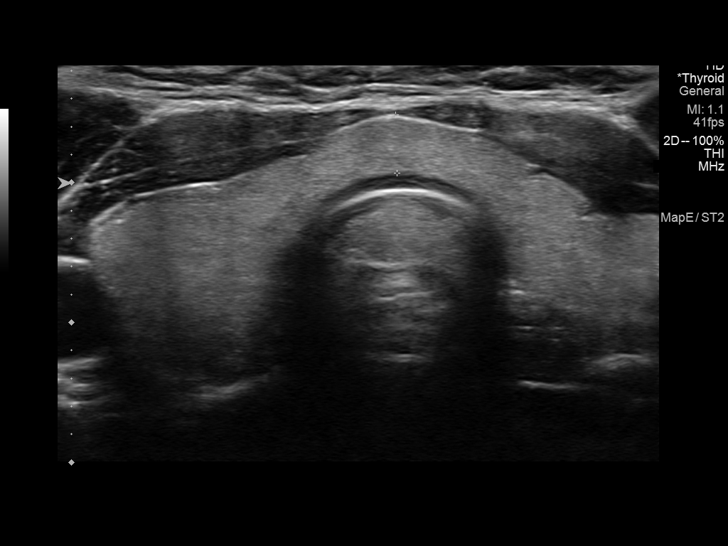
[im 7/40]
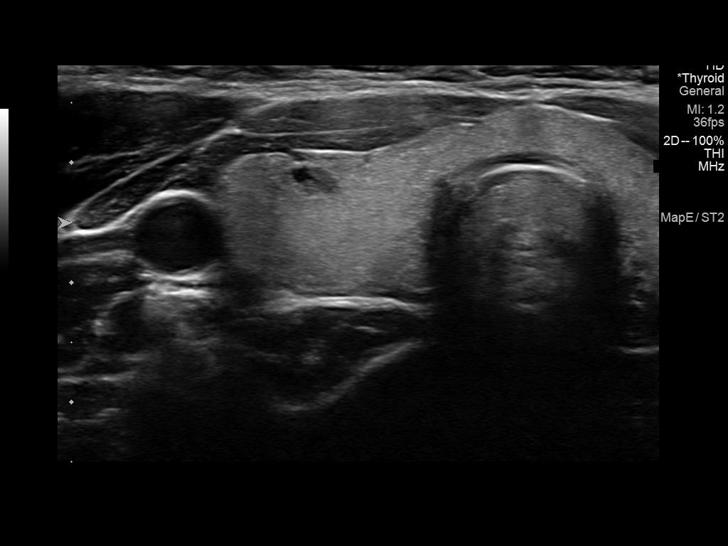
[im 10/40]
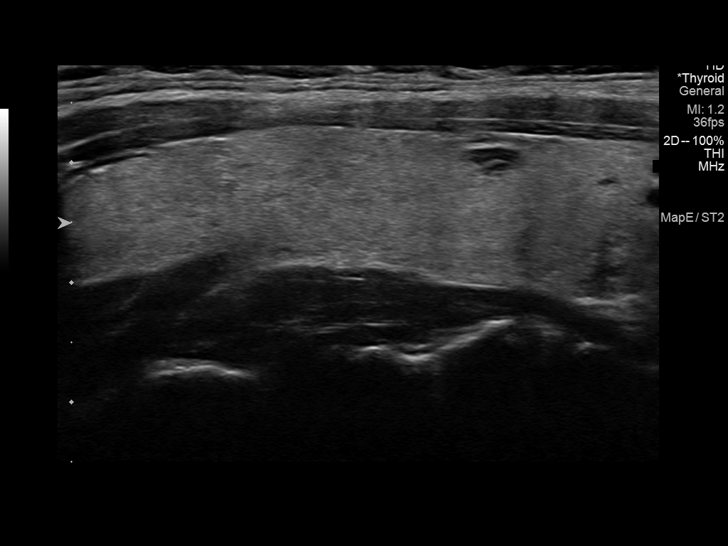
[im 14/40]
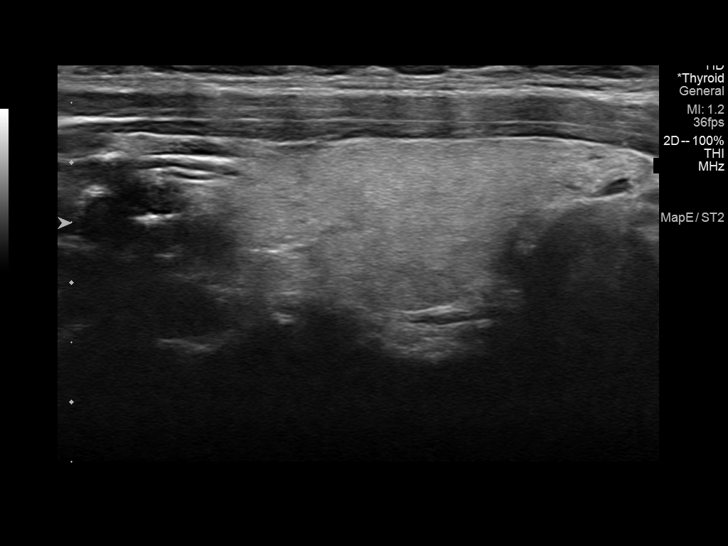
[im 17/40]
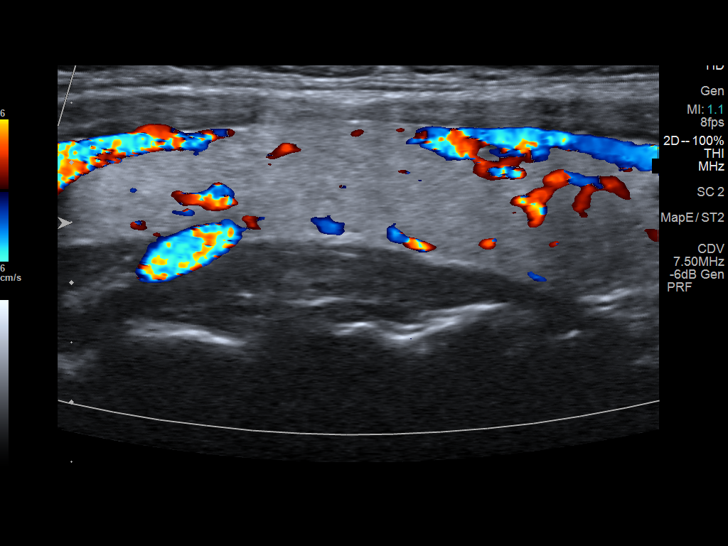
[im 20/40]
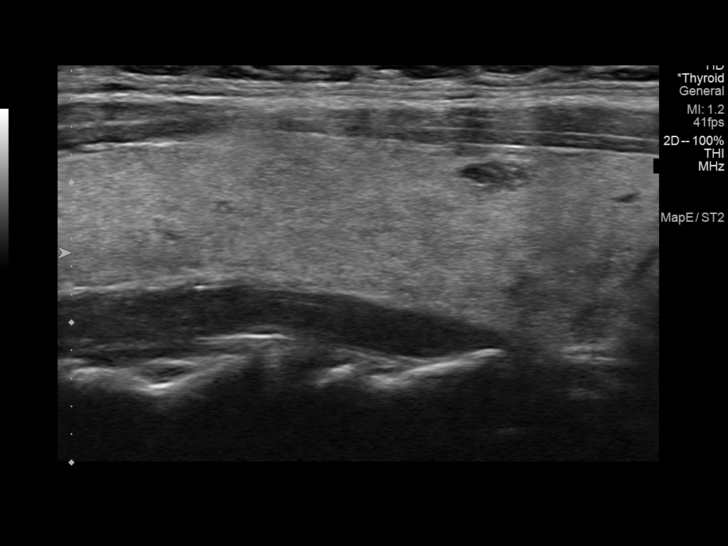
[im 23/40]
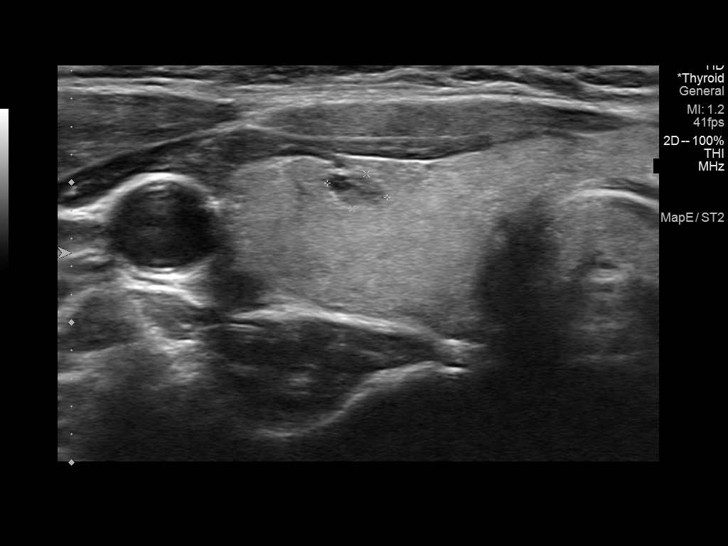
[im 27/40]
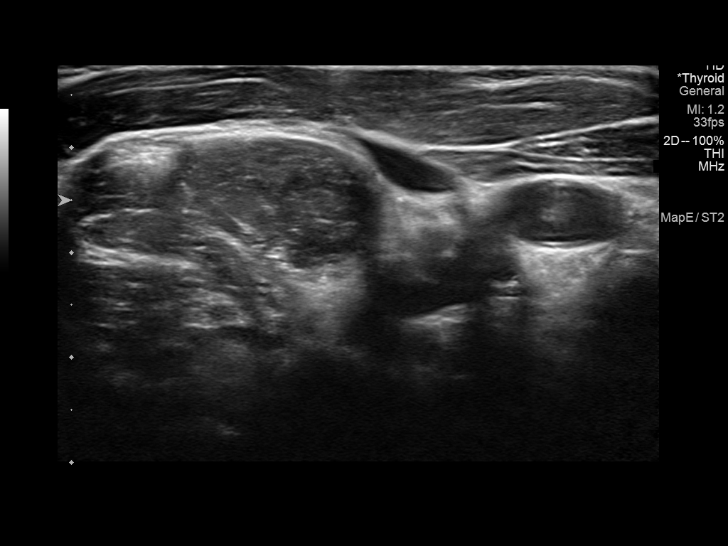
[im 30/40]
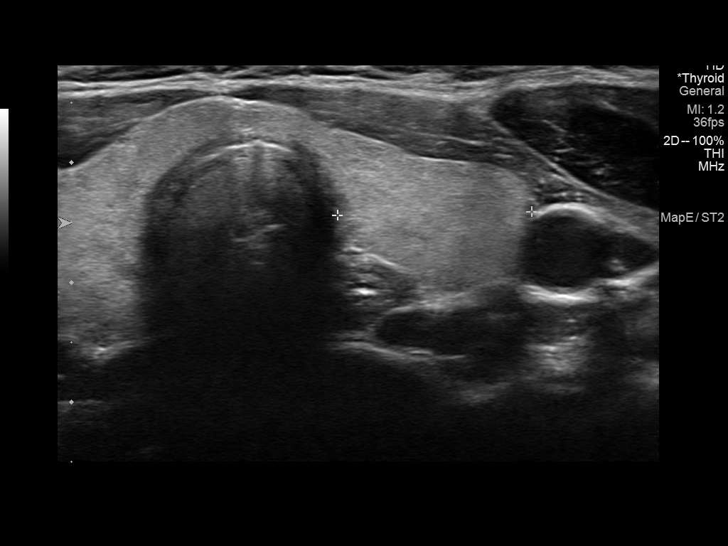
[im 33/40]
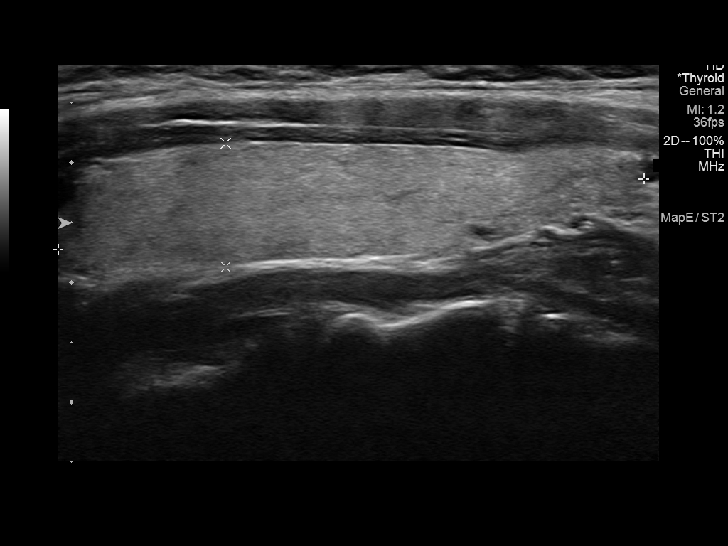
[im 36/40]
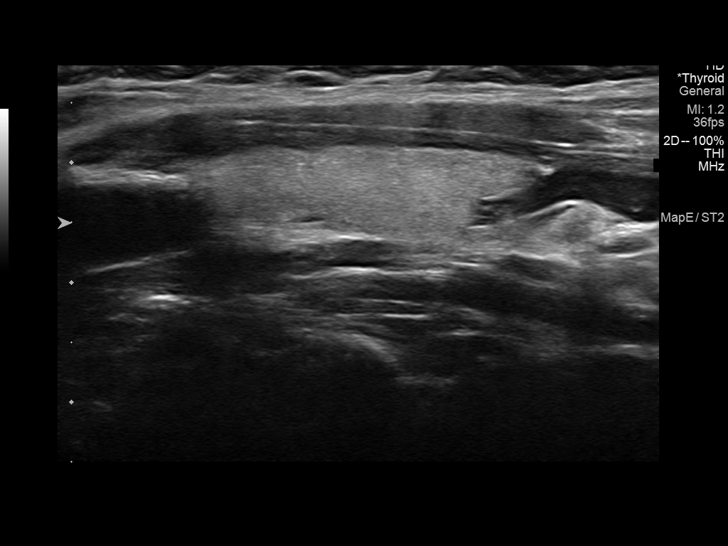
[im 40/40]
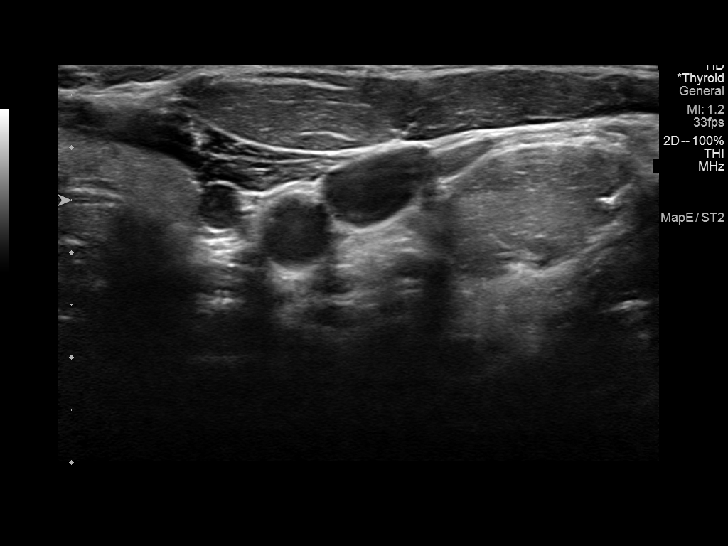

[13 of 25 positions shown; findings below may reference images not displayed]

FINDINGS: Parenchymal Echotexture: Normal

Isthmus: 0.4 cm

Right lobe: 5.5 cm x 1.3 cm x 1.8 cm

Left lobe: 4.9 cm x 1.0 cm x 1.6 cm

_________________________________________________________

Estimated total number of nodules >/= 1 cm: 0

Number of spongiform nodules >/=  2 cm not described below (TR1): 0

Number of mixed cystic and solid nodules >/= 1.5 cm not described
below (TR2): 0

_________________________________________________________

Nodule # :

Location: Right; Inferior

Maximum size: 0.6 cm; Other 2 dimensions: 0.4 cm x 0.3 cm

Composition: spongiform (0)

ACR TI-RADS recommendations:

Spongiform nodule does not meet criteria for surveillance or biopsy

_________________________________________________________

No adenopathy
IMPRESSION: Relatively unremarkable appearance of the thyroid, with no nodule
that meets criteria for surveillance or biopsy.

Recommendations follow those established by the new ACR TI-RADS
criteria ([HOSPITAL] 2456;[DATE]).

## 2019-12-28 ENCOUNTER — Other Ambulatory Visit: Payer: Self-pay

## 2020-01-03 ENCOUNTER — Other Ambulatory Visit (HOSPITAL_COMMUNITY): Payer: Self-pay | Admitting: Internal Medicine

## 2020-01-03 DIAGNOSIS — G43109 Migraine with aura, not intractable, without status migrainosus: Secondary | ICD-10-CM | POA: Diagnosis not present

## 2020-01-03 DIAGNOSIS — E049 Nontoxic goiter, unspecified: Secondary | ICD-10-CM | POA: Diagnosis not present

## 2020-01-03 DIAGNOSIS — J302 Other seasonal allergic rhinitis: Secondary | ICD-10-CM | POA: Diagnosis not present

## 2020-01-03 DIAGNOSIS — I10 Essential (primary) hypertension: Secondary | ICD-10-CM | POA: Diagnosis not present

## 2020-01-03 DIAGNOSIS — Z0000 Encounter for general adult medical examination without abnormal findings: Secondary | ICD-10-CM | POA: Diagnosis not present

## 2020-01-03 DIAGNOSIS — Z13220 Encounter for screening for lipoid disorders: Secondary | ICD-10-CM | POA: Diagnosis not present

## 2020-01-03 MED FILL — SUMATRIPTAN SUCC 25 MG TAB: 25 | 30 days supply | Qty: 8 | Fill #0

## 2020-01-17 MED FILL — NITROFURANTOIN MONO-MCR 100: 100 | 5 days supply | Qty: 10 | Fill #0

## 2020-01-30 DIAGNOSIS — Z20822 Contact with and (suspected) exposure to covid-19: Secondary | ICD-10-CM | POA: Diagnosis not present

## 2020-03-01 MED FILL — SUMATRIPTAN SUCC 25 MG TAB: 25 | 30 days supply | Qty: 8 | Fill #1

## 2020-03-29 MED FILL — SUMATRIPTAN SUCC 25 MG TAB: 25 | 30 days supply | Qty: 8 | Fill #2

## 2020-04-13 ENCOUNTER — Other Ambulatory Visit (HOSPITAL_COMMUNITY): Payer: Self-pay

## 2020-04-13 MED FILL — metroNIDAZOLE 500 MG TABS: 500 | 7 days supply | Qty: 14 | Fill #2

## 2020-04-13 MED FILL — NITROFURANTOIN MONO-MCR 100: 100 | 5 days supply | Qty: 10 | Fill #0

## 2020-05-11 DIAGNOSIS — Z20822 Contact with and (suspected) exposure to covid-19: Secondary | ICD-10-CM | POA: Diagnosis not present

## 2020-05-15 DIAGNOSIS — Z01419 Encounter for gynecological examination (general) (routine) without abnormal findings: Secondary | ICD-10-CM | POA: Diagnosis not present

## 2020-06-06 DIAGNOSIS — Z20822 Contact with and (suspected) exposure to covid-19: Secondary | ICD-10-CM | POA: Diagnosis not present

## 2020-06-06 MED FILL — SUMATRIPTAN SUCC 25 MG TAB: 25 | 30 days supply | Qty: 8 | Fill #3

## 2020-07-27 MED FILL — SUMATRIPTAN SUCC 25 MG TAB: 25 | 30 days supply | Qty: 8 | Fill #4

## 2020-09-06 ENCOUNTER — Other Ambulatory Visit (HOSPITAL_COMMUNITY): Payer: Self-pay

## 2020-09-06 MED ORDER — NITROFURANTOIN MONOHYD MACRO 100 MG PO CAPS
100.0000 mg | ORAL_CAPSULE | Freq: Two times a day (BID) | ORAL | 0 refills | Status: AC
  Filled 2020-09-06: qty 10, 5d supply, fill #0

## 2020-09-08 ENCOUNTER — Other Ambulatory Visit (HOSPITAL_COMMUNITY): Payer: Self-pay

## 2020-09-08 DIAGNOSIS — K2100 Gastro-esophageal reflux disease with esophagitis, without bleeding: Secondary | ICD-10-CM | POA: Diagnosis not present

## 2020-09-08 DIAGNOSIS — E049 Nontoxic goiter, unspecified: Secondary | ICD-10-CM | POA: Diagnosis not present

## 2020-09-08 MED ORDER — OMEPRAZOLE 20 MG PO CPDR
20.0000 mg | DELAYED_RELEASE_CAPSULE | Freq: Every day | ORAL | 0 refills | Status: AC
  Filled 2020-09-08: qty 30, 30d supply, fill #0

## 2020-09-13 DIAGNOSIS — E041 Nontoxic single thyroid nodule: Secondary | ICD-10-CM | POA: Diagnosis not present

## 2020-09-13 DIAGNOSIS — E049 Nontoxic goiter, unspecified: Secondary | ICD-10-CM | POA: Diagnosis not present

## 2020-10-25 ENCOUNTER — Other Ambulatory Visit (HOSPITAL_COMMUNITY): Payer: Self-pay

## 2020-10-25 MED ORDER — ELLA 30 MG PO TABS
ORAL_TABLET | ORAL | 0 refills | Status: AC
  Filled 2020-10-25: qty 2, 10d supply, fill #0

## 2020-10-25 MED FILL — Sumatriptan Succinate Tab 25 MG: ORAL | 14 days supply | Qty: 8 | Fill #0 | Status: AC

## 2021-01-04 ENCOUNTER — Other Ambulatory Visit (HOSPITAL_COMMUNITY): Payer: Self-pay

## 2021-01-04 DIAGNOSIS — Z0000 Encounter for general adult medical examination without abnormal findings: Secondary | ICD-10-CM | POA: Diagnosis not present

## 2021-01-04 DIAGNOSIS — G43109 Migraine with aura, not intractable, without status migrainosus: Secondary | ICD-10-CM | POA: Diagnosis not present

## 2021-01-04 DIAGNOSIS — K2100 Gastro-esophageal reflux disease with esophagitis, without bleeding: Secondary | ICD-10-CM | POA: Diagnosis not present

## 2021-01-04 DIAGNOSIS — J302 Other seasonal allergic rhinitis: Secondary | ICD-10-CM | POA: Diagnosis not present

## 2021-01-04 DIAGNOSIS — Z13220 Encounter for screening for lipoid disorders: Secondary | ICD-10-CM | POA: Diagnosis not present

## 2021-01-04 DIAGNOSIS — Z01419 Encounter for gynecological examination (general) (routine) without abnormal findings: Secondary | ICD-10-CM | POA: Diagnosis not present

## 2021-01-04 MED ORDER — SUMATRIPTAN SUCCINATE 25 MG PO TABS
ORAL_TABLET | ORAL | 3 refills | Status: DC
  Filled 2021-01-04: qty 10, 30d supply, fill #0
  Filled 2021-04-13: qty 10, 30d supply, fill #1
  Filled 2021-06-29: qty 10, 30d supply, fill #2
  Filled 2021-09-25: qty 10, 30d supply, fill #3

## 2021-02-01 DIAGNOSIS — F4320 Adjustment disorder, unspecified: Secondary | ICD-10-CM | POA: Diagnosis not present

## 2021-04-13 ENCOUNTER — Other Ambulatory Visit (HOSPITAL_COMMUNITY): Payer: Self-pay

## 2021-05-01 ENCOUNTER — Other Ambulatory Visit (HOSPITAL_COMMUNITY): Payer: Self-pay

## 2021-05-01 ENCOUNTER — Telehealth: Payer: 59 | Admitting: Nurse Practitioner

## 2021-05-01 DIAGNOSIS — N760 Acute vaginitis: Secondary | ICD-10-CM

## 2021-05-01 MED ORDER — METRONIDAZOLE 0.75 % VA GEL
1.0000 | Freq: Every day | VAGINAL | 0 refills | Status: AC
  Filled 2021-05-01: qty 70, 7d supply, fill #0

## 2021-05-01 NOTE — Progress Notes (Signed)
E-Visit for Vaginal Symptoms  We are sorry that you are not feeling well. Here is how we plan to help! Based on what you shared with me it looks like you: May have a vaginosis due to bacteria  Vaginosis is an inflammation of the vagina that can result in discharge, itching and pain. The cause is usually a change in the normal balance of vaginal bacteria or an infection. Vaginosis can also result from reduced estrogen levels after menopause.  The most common causes of vaginosis are:   Bacterial vaginosis which results from an overgrowth of one on several organisms that are normally present in your vagina.    Trichomoniasis which is caused by a parasite and is commonly transmitted by sexual intercourse.  Factors that increase your risk of developing vaginosis include: Medications, such as antibiotics and steroids Uncontrolled diabetes Use of hygiene products such as bubble bath, vaginal spray or vaginal deodorant Douching Wearing damp or tight-fitting clothing Using an intrauterine device (IUD) for birth control Hormonal changes, such as those associated with pregnancy, birth control pills or menopause Sexual activity Having a sexually transmitted infection  Your treatment plan is Metronidazole or Flagyl vaginal gel to apply at bedtime for 7 days.  I have electronically sent this prescription into the pharmacy that you have chosen.   We would also recommend STI testing if you are sexually active and have had a new partner since being tested last. These bacterial symptoms can also be from an STI that would not be corrected by these antibiotics.   Be sure to take all of the medication as directed. Stop taking any medication if you develop a rash, tongue swelling or shortness of breath. Mothers who are breast feeding should consider pumping and discarding their breast milk while on these antibiotics. However, there is no consensus that infant exposure at these doses would be harmful.   Remember that medication creams can weaken latex condoms. Marland Kitchen   HOME CARE:  Good hygiene may prevent some types of vaginosis from recurring and may relieve some symptoms:  Avoid baths, hot tubs and whirlpool spas. Rinse soap from your outer genital area after a shower, and dry the area well to prevent irritation. Don't use scented or harsh soaps, such as those with deodorant or antibacterial action. Avoid irritants. These include scented tampons and pads. Wipe from front to back after using the toilet. Doing so avoids spreading fecal bacteria to your vagina.  Other things that may help prevent vaginosis include:  Don't douche. Your vagina doesn't require cleansing other than normal bathing. Repetitive douching disrupts the normal organisms that reside in the vagina and can actually increase your risk of vaginal infection. Douching won't clear up a vaginal infection. Use a latex condom. Both female and female latex condoms may help you avoid infections spread by sexual contact. Wear cotton underwear. Also wear pantyhose with a cotton crotch. If you feel comfortable without it, skip wearing underwear to bed. Yeast thrives in [employer] Your symptoms should improve in the next day or two.  GET HELP RIGHT AWAY IF:  You have pain in your lower abdomen ( pelvic area or over your ovaries) You develop nausea or vomiting You develop a fever Your discharge changes or worsens You have persistent pain with intercourse You develop shortness of breath, a rapid pulse, or you faint.  These symptoms could be signs of problems or infections that need to be evaluated by a medical provider now.  MAKE SURE YOU   Understand  these instructions. Will watch your condition. Will get help right away if you are not doing well or get worse.  Thank you for choosing an e-visit.  Your e-visit answers were reviewed by a board certified advanced clinical practitioner to complete your personal care plan.  Depending upon the condition, your plan could have included both over the counter or prescription medications.  Please review your pharmacy choice. Make sure the pharmacy is open so you can pick up prescription now. If there is a problem, you may contact your provider through [employer] and have the prescription routed to another pharmacy.  Your safety is important to [place]. If you have drug allergies check your prescription carefully.   For the next 24 hours you can use MyChart to ask questions about today's visit, request a non-urgent call back, or ask for a work or school excuse. You will get an email in the next two days asking about your experience. I hope that your e-visit has been valuable and will speed your recovery.   I spent approximately 7 minutes reviewing the patient's history, current symptoms and coordinating their plan of care today.    Meds ordered this encounter  Medications   metroNIDAZOLE (METROGEL VAGINAL) 0.75 % vaginal gel    Sig: Place 1 Applicatorful vaginally at bedtime for 7 days.    Dispense:  70 g    Refill:  0

## 2021-05-16 DIAGNOSIS — Z803 Family history of malignant neoplasm of breast: Secondary | ICD-10-CM | POA: Diagnosis not present

## 2021-05-16 DIAGNOSIS — Z01419 Encounter for gynecological examination (general) (routine) without abnormal findings: Secondary | ICD-10-CM | POA: Diagnosis not present

## 2021-05-21 ENCOUNTER — Ambulatory Visit (INDEPENDENT_AMBULATORY_CARE_PROVIDER_SITE_OTHER): Payer: 59 | Admitting: Psychology

## 2021-05-21 DIAGNOSIS — F320 Major depressive disorder, single episode, mild: Secondary | ICD-10-CM
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
## 2021-05-21 NOTE — Progress Notes (Signed)
                Camera Krienke, PhD 

## 2021-06-05 ENCOUNTER — Encounter: Payer: Self-pay | Admitting: Psychology

## 2021-06-05 ENCOUNTER — Ambulatory Visit (INDEPENDENT_AMBULATORY_CARE_PROVIDER_SITE_OTHER): Payer: 59 | Admitting: Psychology

## 2021-06-05 DIAGNOSIS — F320 Major depressive disorder, single episode, mild: Secondary | ICD-10-CM

## 2021-06-05 NOTE — Progress Notes (Signed)
[facility] Testing Progress Note  Patient ID: Nicole Mccall, MRN: [id number],    Date: 06/05/2021  Time Spent: 8:00 - 10:30am   Treatment Type: Testing  Met with patient for testing session.  Patient was at home due to COVID-19 restrictions and session was conducted from therapist's office via video conferencing.  Patient verbally consented to telehealth.  Reported Symptoms: Reason for Visit /Presenting Problem: Patient reports a history of attention deficits and distractibility which makes her feel overwhelmed in being able to complete work tasks.  She is experiencing a current depressive episode and indicated a history of anxiety and hypomania as well.  Testing recommended to evaluate for ADHD as well as other conditions that may be affecting attention.  Mental Status Exam: Appearance:  Neat and Well Groomed     Behavior: Appropriate  Motor: Normal  Speech/Language:  Clear and Coherent and Normal Rate  Affect: Blunted  Mood: normal  Thought process: Word retrieval difficulty  Thought content:   WNL  Sensory/Perceptual disturbances:   WNL  Orientation: oriented to person, place, time/date, and situation  Attention: Fair  Concentration: Fair  Memory: Fair for working [employer] of knowledge:  Good  Insight:   Fair  Judgment:  Good  Impulse Control: Good   Risk Assessment: Danger to Self:  No Self-injurious Behavior: No Danger to Others: No  Behavior Observations: Patient was cooperative and displayed good effort. Attention and concentration were inconsistent overall, as patient exhibited some self correction and frequently asked for questions to be repeated.  She work slowly and often solved problems correctly after the standardized time limit.  She occasionally missed some relatively easy problems or questions as well.  Mood was euthymic with blunted affect.  The results appear representative of current functioning.    Subjective: Testing included the  WAIS-IV (2.5 hrs. for testing and scoring) with the CNS Vital signs, PAI, and BRIEF-A completed online.     Diagnosis:Major depressive disorder, single episode, mild ([place])  Plan: Testing complete. Report writing to be conducted followed by interactive feedback next session.    Rainey Pines, PhD

## 2021-06-05 NOTE — Progress Notes (Signed)
                Sarai January, PhD 

## 2021-06-07 ENCOUNTER — Encounter: Payer: Self-pay | Admitting: Psychology

## 2021-06-07 NOTE — Progress Notes (Signed)
Nicole Mccall is a 35 y.o. female patient. Report writing competed ( 2 hrs.) from 8:30 - 10:30 am.  Interactive feedback to be conducted next session. Report to be attached to the feedback progress note.  Rainey Pines, PhD

## 2021-06-13 ENCOUNTER — Telehealth: Payer: 59 | Admitting: Physician Assistant

## 2021-06-13 DIAGNOSIS — R3989 Other symptoms and signs involving the genitourinary system: Secondary | ICD-10-CM

## 2021-06-14 ENCOUNTER — Other Ambulatory Visit (HOSPITAL_COMMUNITY): Payer: Self-pay

## 2021-06-14 MED ORDER — SULFAMETHOXAZOLE-TRIMETHOPRIM 800-160 MG PO TABS
1.0000 | ORAL_TABLET | Freq: Two times a day (BID) | ORAL | 0 refills | Status: AC
  Filled 2021-06-14: qty 10, 5d supply, fill #0

## 2021-06-14 NOTE — Progress Notes (Signed)

## 2021-06-26 ENCOUNTER — Encounter: Payer: Self-pay | Admitting: Psychology

## 2021-06-26 ENCOUNTER — Ambulatory Visit (INDEPENDENT_AMBULATORY_CARE_PROVIDER_SITE_OTHER): Payer: 59 | Admitting: Psychology

## 2021-06-26 DIAGNOSIS — F902 Attention-deficit hyperactivity disorder, combined type: Secondary | ICD-10-CM | POA: Diagnosis not present

## 2021-06-26 DIAGNOSIS — F411 Generalized anxiety disorder: Secondary | ICD-10-CM

## 2021-06-26 NOTE — Progress Notes (Signed)
                Abcde Oneil, PhD 

## 2021-06-26 NOTE — Progress Notes (Signed)
Psychological Testing Report - Confidential  Identifying Information:               Patient's Name:   Nicole Mccall  Date of Birth:   April 06, 1986     Age:                36 years  MRN#:                                   [id number]      Dates of Assessment:  June 05, 2021         Purpose of Evaluation:  The purpose of the evaluation is to provide diagnostic information and treatment recommendations.     Referral Information: Nicole Mccall was a 36 year old Black female.  She was referred to [place] for psychological testing regarding suspected attention deficits.  Nicole Mccall reported having difficulty focusing, especially on one task at a time.  She becomes irritated when she can't focus and ends up not getting anything done.  Nicole Mccall becomes particularly stressed when she has more than one activity to do at a time.  She has trouble sitting still and always has to be moving, especially at home.  She is not able to sit through a movie without distracting herself in some way.         Relevant Background Information:  Developmental history was reported to be significant for motor delays.  There were no delays in early milestones per Nicole Mccall's awareness.  Regarding gross motor skills, Nicole Mccall reported being clumsy.  She worked on hand-eye coordination at a child development center and does not currently participate in any sports or formal physical activity.  Fine motor skills, including handwriting and drawing, were reported to be typically developed now.  Her overall fine motor skills were reported to be adequate on most days, but she has some days where she struggles.  Speech was reported to be adequately developed, although Nicole Mccall indicated that she will sometimes talk too quickly, and people cannot understand what she is saying.  Self-care was reported to be typical most of the time, but she struggles keeping up with these activities when she is excessively  active or overwhelmed.  During these times, Nicole Mccall will either forget to do them or feel she has too much to do to take care of herself.   Independent skills were reported to be adequate as she keeps up with bills through autopay.  Socially, [name] reported getting along well with others and having good relationships most of the time.             Medical history was reported to be significant for Anemia, Anxiety, Fibroids, GERD (gastroesophageal reflux disease), Hypertension, Sickle cell trait, and Thyroiditis.  A history of allergies, concussions, seizures, or head injuries was denied.  Current medications and supplements include Biotin w/ Vitamins C & E, ferrous sulfate 325 (65 FE), hydrochlorothiazide (MICROZIDE) 12.5 MG, Multiple Vitamins-Minerals nitrofurantoin, macrocrystal-monohydrate, (MACROBID) 100 MG capsule, omeprazole (PRILOSEC) 20 MG, Probiotic Product, SUMAtriptan (IMITREX) 25 MG, and ulipristal acetate (ELLA) 30 MG.  Surgical History is significant for HYSTEROSCOPY and WISDOM TOOTH EXTRACTION.  Previous psychological history is significant for Attention Deficit Hyperactivity Disorder (ADHD) and depression.  Nicole Mccall previously saw a psychotherapist and is currently searching for another therapist as she did not feel comfortable with that provider.  She has not  been hospitalized for mental health reasons.  Previous psychological testing occurred during age 2-5 due to excessive talking and activity.  She ended up attending an early childhood development center. Substance abuse/dependence was denied as Nicole Mccall indicated only occasional alcohol use, 1-2 drinks 3-4x per month.                       Educationally, Nicole Mccall reported that she earned her master's degree in health administration.  She indicated performing adequately in school, earning average grades (A-C).  She struggled with math and chemistry but was strong with biology.  She did not receive any accommodations during  school but participated in a developmental class after school from preschool to 3rd grade.  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  She has been working for [employer] for 7 years and has been her in current position for 2 years.  She struggles with consistently focusing during work and completing tasks.  Leisure Activities include reading, travel, yard work, being outside.   Nicole Mccall is currently living alone but stays over partner's Marland Kitchen) house several times per week.  Nicole Mccall is single and does not have any children.  Social support resources include her partner.  Family mental health history was reported to be significant for her grandfather having Schizoaffective Affective Disorder and her mother being hospitalized for depression and anxiety (she still struggles with these conditions). Childhood history was reported to be generally typical, although she was arrested for shoplifting during high school and received community service.  Current stressors include occupational concerns and budgeting with the current economy.  She does not have good coping mechanisms for work and will engage in impulsive eating or shopping to help cope with work issues.   Presenting Symptomology:  Nicole Mccall reported that her sleep is adequate.  She falls asleep easily but has problems staying asleep.  She sleeps about 5-6 hours per night and typically wakes tired.  Nicole Mccall indicated that her mind starts racing and then fears of having too much to do begin.  Her appetite fluctuates as Nicole Mccall sometimes forgets to eat.  Her energy is adequate, but she feels exhausted between 12-2pm.  Episodes of depressed mood were reported this past October.  Her mood improved during the winter holidays, but the depression returned after they were over.  Some helpless and hopeless were reported but not often (1-2x per month).  Some mania was also reported at times (4-5  times over the past year).  These episodes last for about a day.  Nicole Mccall has bouts of anxiety but no panic.  The anxiety is triggered by having a lot of work to do.  She fears not being able to finish it all.  She indicated being a Copy and over thinker with some bouts of social anxiety.  A history of trauma or abuse was denied.  Obsessive thoughts include feeling inadequate, not doing enough, or not performing at her best.  She can be compulsive with spending.  Ms. Bazar reported having trouble paying attention and focusing.  This has always been a problem but had been able to compensate for this until now.  She was on medication for attention deficits through elementary school (Adderall) and high school (Concerta).  She becomes easily distracted.  Ms. Przybysz does not lose many items but frequently forgets important information or activities.  Her organization was reported to be good  and necessary otherwise she will forget or get distracted.  Ms. Cerrito reported being overly restless/fidgety with some interrupting and impulsive behavior.                  Procedures Administered: [facility] 7  9 Client: Jena E. Wenz       DOB:  1986-01-24       MRN# [id number]  [place], [place], [address] Adult Intelligence Scale - IV Behavior Rating Inventory for Executive Function - Adult Version (BRIEF-A) 7  9 Client: Syniah E. Hack       DOB:  Apr 30, 1986       MRN# [id number]  [place], [place], [address] CNS Vital Signs 7  9 Client: Valencia Angus       DOB:  29-Aug-1985       MRN# [id number]  [place], [place], [address] Adult ADHD Self Report Scale  7  9 Client: Ferguson Devito       DOB:  May 28, 1985       MRN# [id number]  [place], [place], [address] Personality Assessment  Inventory7  9 Client: [place] Cadavid       DOB:  17-Aug-1985       MRN# [id number]  [place], [place], [address]  - Self Report  Behavioral Observations:  Ms. Valenti was cooperative and displayed good effort. Attention and concentration were inconsistent overall, as she exhibited some self-correction and frequently asked for questions to be repeated.  She worked slowly and often solved problems correctly after the standardized time limit.  She occasionally missed some relatively easy problems or questions as well.  Mood was euthymic with blunted affect.  The results appear representative of current functioning.  Brief mental status indicated typical general orientation and alertness.  Recent, remote, and immediate memory were intact with impaired working and delayed memory.  Judgement and impulse control were good while insight was fair.  Hallucinations, delusions, and thoughts of self-harm were denied.         Test Results and Interpretation:   General Intellectual Functioning:  Wechsler Adult Intelligence Scale - IV Composite Score Summary  Composite  Sum of Scaled Scores Composite Score Percentile Rank 95% Confidence Interval Qualitative Description  Verbal Comprehen. VCI 29 98 45 92-104 Average  Perceptual Reason. PRI 30       100 50 94-106 Average  Working Memory WMI 15 86 18 80-94 Low Average  Processing Speed PSI 10 74   4 68-85 Low  Full Scale IQ* FSIQ 84 89 23 85-93 Low Average  *Based on 10 subtests  Domain Subtest Name  Total Raw Score Scaled Score Percentile Rank  Verbal Similarities SI 19   7 16   Comprehension Vocabulary VC 40 11 63   Information IN 15 11 75  Perceptual Reas. Matrix Reasoning MR 21 12 75   Visual Puzzles VP 12   8 25    Figure Weights FW 16 10 50   Picture Comp. PC   4   3   1   Working Mem. Digit Span DS 26   9 37   Arithmetic AR   9   6   9   Processing Speed Symbol Search SS 17   4   2    Coding  CD 47   6   9   The WAIS-IV was used to  assess Ms. Fulbright's performance across four areas of cognitive ability. When interpreting her scores, it is important to view the results as a snapshot of her current intellectual functioning. As measured by the WAIS-IV, her overall FSIQ score fell in the low Average range when compared to other adults her age (FSIQ = 15). She showed age-typical performance on verbal comprehension (VCI = 98) and Perceptual Reasoning (PSI = 100) while working memory tasks (WM = 86) were low average and processing speed (PSI = 74) was low.  This indicating better developed language skills and visual perception, than auditory memory and thinking/work speed.  In general, her conceptual understanding is better developed than her processing.  On individual subtests, Ms. Ade struggled the most on tasks measuring visual abstract attention to visual detail, quick decision making, copying, and arithmetic reasoning, while strength was noted with visual pattern analysis, vocabulary, and general knowledge.       Attention and Processing:                                                       CNS Vital Signs   Domain Scores Standard Score %ile Validity Indicator Guideline  Neurocognitive Index 66 1 Yes Very Low  Composite Memory 59 1 Yes Very Low  Verbal Memory 51 1 Yes Very Low  Visual Memory 82 12 Yes Below Average  Psychomotor speed 79 8 Yes Low  Reaction Time 77 6 Yes Low  Complex Attention 50 1 Yes Very Low  Cognitive Flexibility 65 1 Yes Very Low  Processing Speed 90 25 Yes Average  Executive Function 67 1 Yes Very Low  Working Memory 78 7 No Low  Sustained Attention 72 3 No Low  Simple Attention  35 1 Yes Very Low  Motor Speed 81 10 Yes Below Average   The results of the CNS Vital Signs testing indicated very low neurocognitive processing ability, at a level below measured intellectual ability (low average).  Regarding areas related to attention problems, simple attention,  complex attention, cognitive flexibility, and executive function were very low while sustained attention was low.  These are the domains most closely associated with attention deficits.  Motor/psychomotor speed was below average to low, as was reaction time, while processing speed was average.  This indicates slow hand-eye coordination and response speed with typical thinking speed on computerized measures.  Visual memory was below average, while verbal memory was very low, indicating better developed memory for pictures than words.  Working memory was low suggesting difficulty with multitasking.  The results suggest that Ms. Melena appears to have below age typical ability attending to simple and complex tasks with impaired memory and responsiveness.  The working memory and sustained attention scores should be interpreted with caution due to an invalid result on the four-part continuous performance test.   Executive Function:  Ms. Hepp completed the Self-Report Form of the Behavior Rating Inventory of Executive Function-Adult Version (BRIEF-A) on 06/03/2021. There are no missing item responses in the protocol. Ratings of Ms. Ralphs's self-regulation do not appear overly negative. Items were completed in a reasonable fashion, suggesting that the respondent did not respond to items in a haphazard or extreme manner. Responses are reasonably consistent. In the context of these validity considerations, ratings of Ms. Dib's everyday executive function suggest some areas of concern. The overall index, the  Global Executive Composite (GEC), was elevated (GEC T = 77, %ile = 98). Both the Behavioral Regulation (BRI) and the Metacognition (MI) Indexes were elevated (BRI T = 71, %ile = 96 and MI T = 78, %ile = 99).     Within these summary indicators, all the individual scales are valid. One or more of the individual BRIEF-A scales were elevated, suggesting that Ms. Jungman reports difficulty with some aspects of  executive function. Concerns are noted with her ability to inhibit impulsive responses, adjust to changes in routine or task demands, initiate problem solving or activity, sustain working memory, plan and organize problem-solving approaches, attend to task-oriented output, and organize environment and materials. Additionally, Ms. Nader's elevated scores on the Inhibit scale, and the Behavioral Regulation and the Metacognition Indexes, suggest that Ms. Weng is perceived as having poor inhibitory control and suggest that more global behavioral dysregulation is having a negative effect on active problem solving.  Ms. Peffley ability to modulate emotions and monitor social behavior is not described as problematic.       These scores are suggestive of organizational and behavior regulation problems associated with ADHD.    BRIEFA Self-Report Score Summary Table Scale/Index Raw score T score Percentile  Inhibit 20 78 98  Shift 14 74 98  Emotional Control 18 57 77  Self-Monitor 12 64 94  Behavioral Regulation Index (BRI) 64 71 96  Initiate 17 68 96  Working Memory 20 81 99  Plan/Organize 24 77 99  Task Monitor 15 79 >99  Organization of Materials 18 66 96  Metacognition Index (MI) 94 70 99  Global Executive Composite (GEC) 158 77 98         Behavioral - Emotional Functioning: Ratings of behavior and emotional functioning indicated much difficulty with attention and organizational difficulty, along with some problems regarding physical restlessness and impulse control.  On the Adult ADHD Self Report Scale, Ms. Raynor positively endorsed, as occurring often or very often, 7 of 9 items for intention/poor organization and 5 of 9 items for hyperactivity and poor impulse control.  Additionally, 5 of 6 critical items were highly endorsed including problems completing tasks and starting tasks, along with problems organizing, frequent fidgeting, and feeling compelled to move.  Trouble remembering  appointments/meetings was endorse as occurring sometimes.  Endorsement of at least 5 items in either category along with 4 critical items is considered at-risk for ADHD.   Ms. Mascio's scores on the Personality Assessment Inventory indicated a very high level of schizophrenia and antisocial behavior along with high levels of anxiety, anxiety related disorders, and borderline personality.  Treatment resistance and warmth were excessively low, indicating being highly receptive to intervention but very hesitant to attach emotionally.  Regarding more specific areas, extreme impairment was noted with psychotic experiences (auditory hallucinations), disorganized thought, antisocial behavior, and sensation seeking.  High impairment was rated regarding physical and sensory experiences without neurological cause, cognitive affective and physical anxiety, obsessive-compulsive tendencies, traumatic stress, hypervigilance, and self-harm.  The results are most consistent with schizophrenia, anxiety, and post-traumatic stress disorder (PTSD).  The scores are considered valid, although an extremely low score was noted on positive impression management, suggesting low self-esteem.    Summary:   Ms. Guido reported a history of attention deficits and distractibility which makes her feel overwhelmed in being able to complete work tasks.  She is experiencing a current depressive episode and indicated a history of attention deficits, anxiety, and hypomania as well.  Family history is significant for Schizoaffective  disorder, anxiety, and depression.  Testing was recommended to evaluate for ADHD as well as other conditions that may be affecting attention.  Test results indicated low average overall intelligence (WAIS-IV), with evenly developed Verbal Comprehension and Nonverbal Reasoning (average) with a relative weakness in Processing Speed (low).  Neurocognitive skills testing indicated very low attention for simple and complex  tasks with impaired memory and responsiveness.  Ratings of executive function (EF) indicated clinically significant impairment overall, with noticeable impairment all areas related to behavior and cognitive regulation.  Ratings for behavior consistent with ADHD indicated significant difficulty with attention/organization as well as some problems with hyperactivity and impulse control.  Ratings for emotional functioning suggested very high levels of schizophrenia and antisocial related behavior along with high levels of anxiety, obsessive thought, trauma related symptoms and self-harm with low self-image and emotional connectedness.  Ms. Mateja continues to meet the criterion for ADHD, originally diagnosed during childhood, although there appears to be a psychotic or trauma related disorder in addition to high anxiety.   Recommendations include discussing results with Primary Care Physician or psychiatrist, developing a visual organization system, and resuming individual counseling.  See below for further recommendations.        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  Stimulant medication may be helpful for attention, although it could exacerbate Ms. Frieling's already high level of anxiety and hallucinations.  Consider medication to help lessen the intensity of anxiety and hallucinatory experiences.   Individual counseling is recommended to resume to help Ms. Hynson with developing emotion regulation skills and manage disordered thought.  Ms. Ehlert may benefit from a Dialectical Behavior Therapy approach that focuses on the teaching of coping, mindfulness, and emotion regulation skills.  Apps such as Headspace and Calm can help  with practicing mindfulness activities.    It may help Ms. Lerner if she discussed her conditions with her employer to determine the need for any accommodations.  Recommended accommodations may include having extra time to process instructions, along with being able to take breaks when overwhelmed.  Prior notice with alternatives would be needed to help Ms. Mcmahan adjust to any changes in her schedule.   Mental alertness/energy can also be raised by increasing exercise, improving sleep, eating a healthy diet, and managing depression/stress.  Consult with a physician regarding any changes to physical regimen.  Compensatory strategies for Executive function impairment can take several forms including using external aides (e.g., use of a notebook), learning cognitive strategies (e.g., verbalization), and making environmental modifications (e.g., keeping workspace clutter-free). Research has demonstrated that both healthy adults as well as individuals who have executive deficits commonly rely on external aids for executive and other cognitive processes. More frequent use of aides or strategies may be helpful when it comes to memory, and this also may hold true for executive dysfunction.        Revonda Standard Kennie Snedden, Ph.D. Licensed Psychologist - [facility] [place] Licensed psychologist [phone]               Rainey Pines, PhD

## 2021-06-29 ENCOUNTER — Other Ambulatory Visit (HOSPITAL_COMMUNITY): Payer: Self-pay

## 2021-09-25 ENCOUNTER — Other Ambulatory Visit (HOSPITAL_COMMUNITY): Payer: Self-pay

## 2021-09-27 ENCOUNTER — Other Ambulatory Visit: Payer: Self-pay | Admitting: Family Medicine

## 2021-09-27 ENCOUNTER — Ambulatory Visit: Payer: Self-pay

## 2021-09-27 DIAGNOSIS — M25531 Pain in right wrist: Secondary | ICD-10-CM

## 2022-01-22 ENCOUNTER — Ambulatory Visit: Payer: Self-pay

## 2022-01-25 DIAGNOSIS — Z0000 Encounter for general adult medical examination without abnormal findings: Secondary | ICD-10-CM | POA: Diagnosis not present

## 2022-01-25 DIAGNOSIS — M778 Other enthesopathies, not elsewhere classified: Secondary | ICD-10-CM | POA: Diagnosis not present

## 2022-01-25 DIAGNOSIS — G43109 Migraine with aura, not intractable, without status migrainosus: Secondary | ICD-10-CM | POA: Diagnosis not present

## 2022-01-25 DIAGNOSIS — Z23 Encounter for immunization: Secondary | ICD-10-CM | POA: Diagnosis not present

## 2022-01-25 DIAGNOSIS — K2100 Gastro-esophageal reflux disease with esophagitis, without bleeding: Secondary | ICD-10-CM | POA: Diagnosis not present

## 2022-01-25 DIAGNOSIS — J302 Other seasonal allergic rhinitis: Secondary | ICD-10-CM | POA: Diagnosis not present

## 2022-01-28 ENCOUNTER — Other Ambulatory Visit (HOSPITAL_COMMUNITY): Payer: Self-pay

## 2022-01-28 MED ORDER — SUMATRIPTAN SUCCINATE 25 MG PO TABS
25.0000 mg | ORAL_TABLET | ORAL | 3 refills | Status: AC
  Filled 2022-01-28: qty 10, 30d supply, fill #0

## 2022-02-03 ENCOUNTER — Telehealth: Payer: Self-pay | Admitting: Family

## 2022-02-03 DIAGNOSIS — R399 Unspecified symptoms and signs involving the genitourinary system: Secondary | ICD-10-CM

## 2022-02-03 MED ORDER — CEPHALEXIN 500 MG PO CAPS
500.0000 mg | ORAL_CAPSULE | Freq: Two times a day (BID) | ORAL | 0 refills | Status: AC
  Filled 2022-02-03: qty 14, 7d supply, fill #0

## 2022-02-03 NOTE — Progress Notes (Signed)

## 2022-02-04 ENCOUNTER — Other Ambulatory Visit (HOSPITAL_COMMUNITY): Payer: Self-pay

## 2022-02-05 ENCOUNTER — Other Ambulatory Visit (HOSPITAL_COMMUNITY): Payer: Self-pay

## 2022-08-08 ENCOUNTER — Other Ambulatory Visit (HOSPITAL_COMMUNITY): Payer: Self-pay

## 2023-06-23 ENCOUNTER — Other Ambulatory Visit: Payer: Self-pay | Admitting: Nurse Practitioner

## 2023-06-23 ENCOUNTER — Other Ambulatory Visit (HOSPITAL_COMMUNITY)
Admission: RE | Admit: 2023-06-23 | Discharge: 2023-06-23 | Disposition: A | Discharge status: Home / Self Care | Payer: Self-pay | Source: Ambulatory Visit | Attending: Nurse Practitioner | Admitting: Nurse Practitioner

## 2023-06-23 DIAGNOSIS — Z124 Encounter for screening for malignant neoplasm of cervix: Secondary | ICD-10-CM | POA: Diagnosis present

## 2023-06-26 LAB — CYTOLOGY - PAP
Comment: NEGATIVE
Diagnosis: NEGATIVE
Diagnosis: REACTIVE
High risk HPV: NEGATIVE

## 2023-06-27 ENCOUNTER — Other Ambulatory Visit: Payer: Self-pay | Admitting: Nurse Practitioner

## 2023-06-27 DIAGNOSIS — Z1231 Encounter for screening mammogram for malignant neoplasm of breast: Secondary | ICD-10-CM

## 2023-07-09 ENCOUNTER — Ambulatory Visit
Admission: RE | Admit: 2023-07-09 | Discharge: 2023-07-09 | Disposition: A | Discharge status: Home / Self Care | Payer: Self-pay | Source: Ambulatory Visit | Attending: Nurse Practitioner | Admitting: Nurse Practitioner

## 2023-07-09 DIAGNOSIS — Z1231 Encounter for screening mammogram for malignant neoplasm of breast: Secondary | ICD-10-CM

## 2023-07-14 ENCOUNTER — Other Ambulatory Visit: Payer: Self-pay | Admitting: Nurse Practitioner

## 2023-07-14 DIAGNOSIS — R928 Other abnormal and inconclusive findings on diagnostic imaging of breast: Secondary | ICD-10-CM

## 2023-07-23 ENCOUNTER — Ambulatory Visit
Admission: RE | Admit: 2023-07-23 | Discharge: 2023-07-23 | Disposition: A | Discharge status: Home / Self Care | Source: Ambulatory Visit | Attending: Nurse Practitioner | Admitting: Nurse Practitioner

## 2023-07-23 ENCOUNTER — Other Ambulatory Visit: Payer: Self-pay | Admitting: Nurse Practitioner

## 2023-07-23 DIAGNOSIS — R928 Other abnormal and inconclusive findings on diagnostic imaging of breast: Secondary | ICD-10-CM

## 2023-07-23 DIAGNOSIS — N6320 Unspecified lump in the left breast, unspecified quadrant: Secondary | ICD-10-CM

## 2023-08-04 ENCOUNTER — Other Ambulatory Visit: Payer: Self-pay | Admitting: Body Imaging

## 2023-08-04 ENCOUNTER — Ambulatory Visit
Admission: RE | Admit: 2023-08-04 | Discharge: 2023-08-04 | Disposition: A | Discharge status: Home / Self Care | Source: Ambulatory Visit | Attending: Nurse Practitioner | Admitting: Nurse Practitioner

## 2023-08-04 ENCOUNTER — Other Ambulatory Visit

## 2023-08-04 DIAGNOSIS — N6320 Unspecified lump in the left breast, unspecified quadrant: Secondary | ICD-10-CM

## 2023-08-04 HISTORY — PX: BREAST BIOPSY: SHX20

## 2023-08-05 LAB — SURGICAL PATHOLOGY
